# Patient Record
Sex: Female | Born: 1935 | Race: White | Hispanic: No | Marital: Married | Smoking: Never smoker
Health system: Southern US, Community
[De-identification: ages and names within clinical notes are randomized; demographics above are authoritative.]

## PROBLEM LIST (undated history)

## (undated) DIAGNOSIS — E7800 Pure hypercholesterolemia, unspecified: Secondary | ICD-10-CM

## (undated) DIAGNOSIS — I340 Nonrheumatic mitral (valve) insufficiency: Secondary | ICD-10-CM

## (undated) DIAGNOSIS — E569 Vitamin deficiency, unspecified: Secondary | ICD-10-CM

## (undated) DIAGNOSIS — I10 Essential (primary) hypertension: Secondary | ICD-10-CM

## (undated) HISTORY — PX: APPENDECTOMY: SHX54

---

## 2004-08-04 ENCOUNTER — Ambulatory Visit: Payer: Self-pay | Admitting: Internal Medicine

## 2004-09-14 ENCOUNTER — Emergency Department: Payer: Self-pay | Admitting: Emergency Medicine

## 2004-09-15 ENCOUNTER — Ambulatory Visit: Payer: Self-pay | Admitting: Emergency Medicine

## 2005-06-04 IMAGING — CT CT ABD-PELV W/O CM
1 of 2 series · 15 of 32 positions shown, 19 images · non-contrast
Comparison: none

REASON FOR EXAM: RUQ pain, blood in urine, evaluate for kidney stone
COMMENTS:

PROCEDURE:     CT  - CT ABDOMEN AND PELVIS W[DATE]  [DATE]
RESULT:     Emergent unenhanced images were obtained through the abdomen.
The scan was originally read by [REDACTED].

[Series 2: stone · axial · 0.69mm/px · z∈[-626,-210]mm · 15 of 153 slices shown, 19 images]
[im 7/153  soft-tissue]
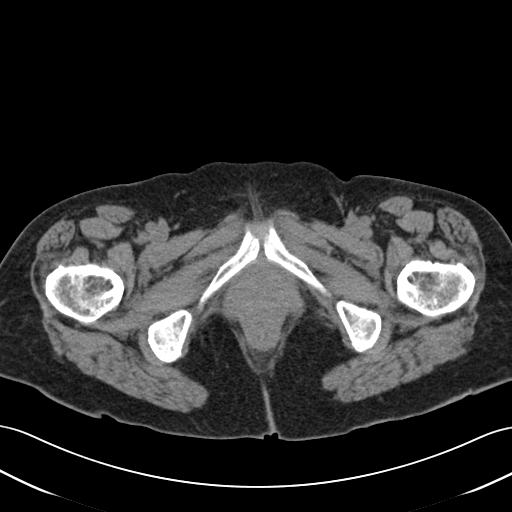
[im 7/153  bone]
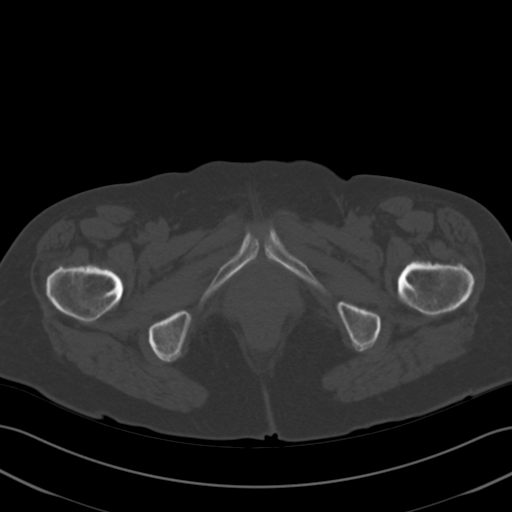
[im 20/153  soft-tissue]
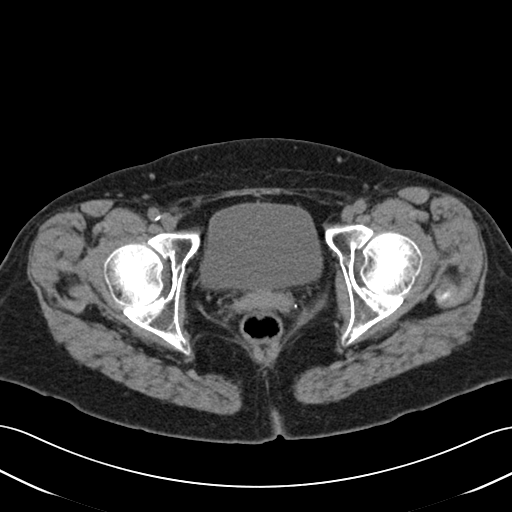
[im 32/153  soft-tissue]
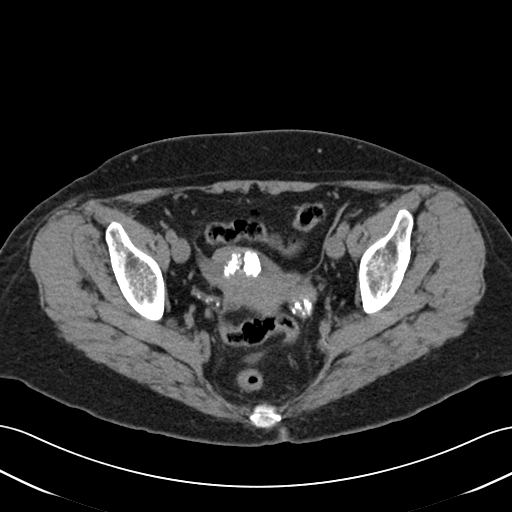
[im 45/153  soft-tissue]
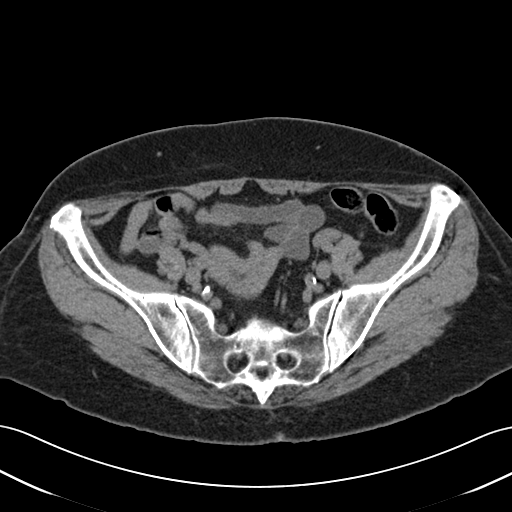
[im 51/153  soft-tissue]
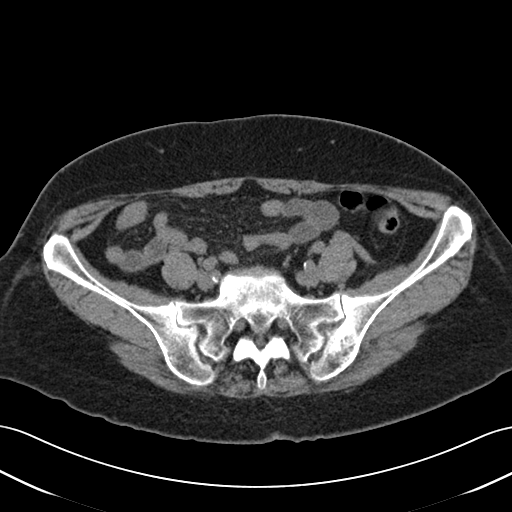
[im 64/153  soft-tissue]
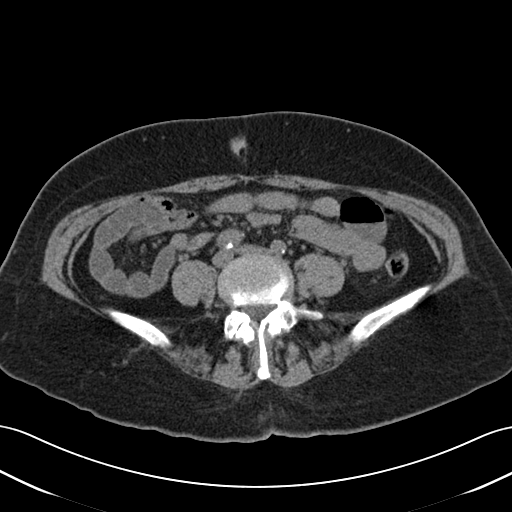
[im 77/153  soft-tissue]
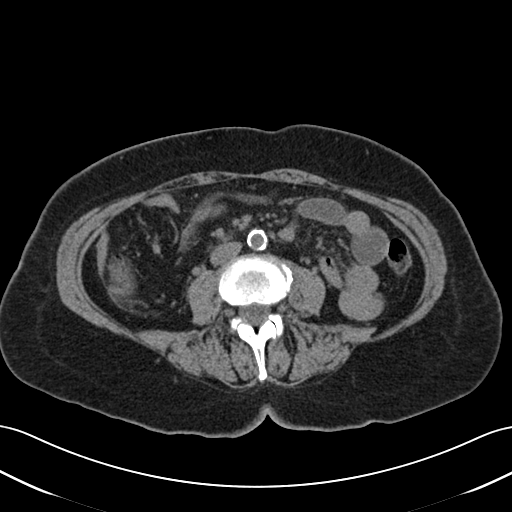
[im 89/153  soft-tissue]
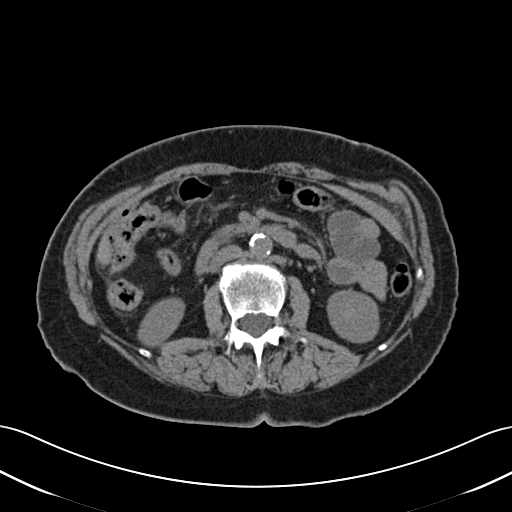
[im 102/153  soft-tissue]
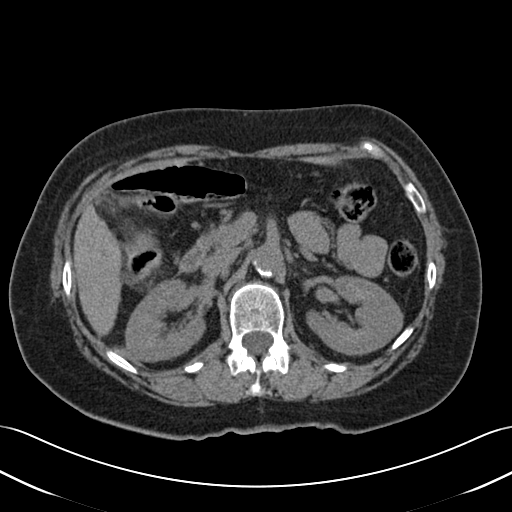
[im 102/153  bone]
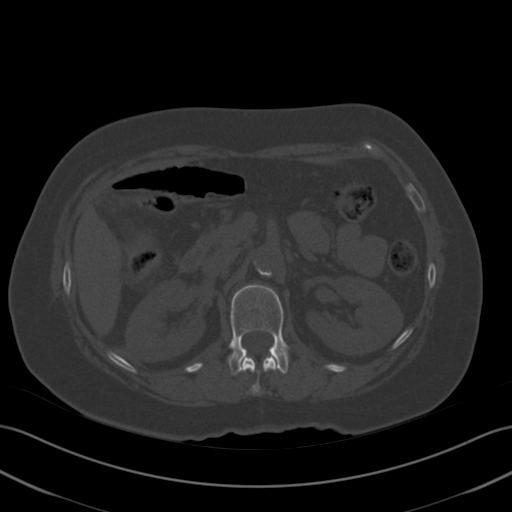
[im 108/153  soft-tissue]
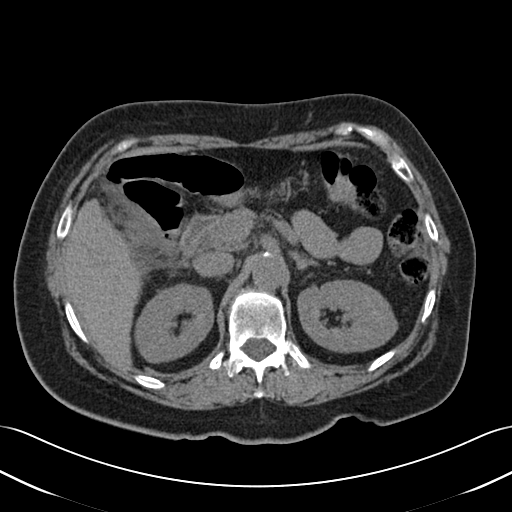
[im 121/153  soft-tissue]
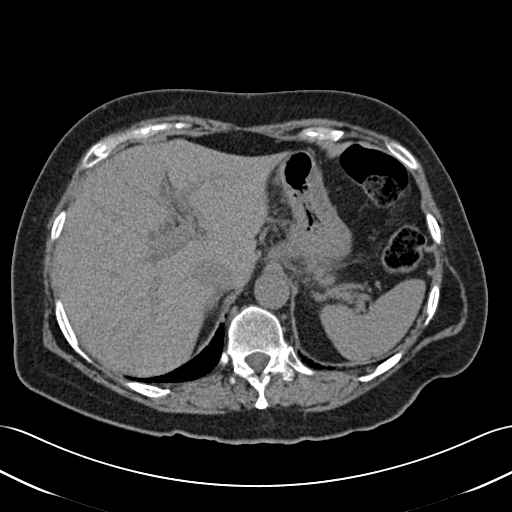
[im 127/153  lung]
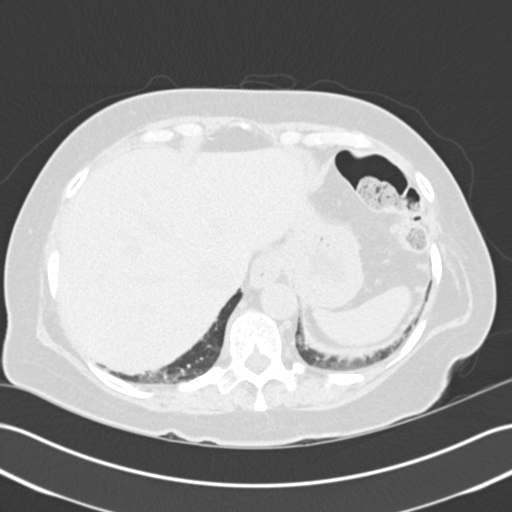
[im 134/153  soft-tissue]
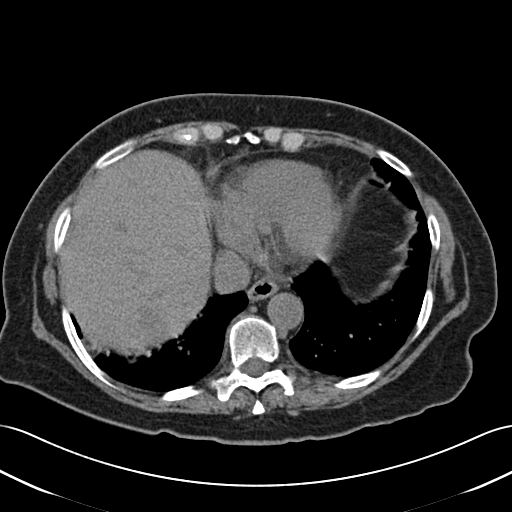
[im 134/153  lung]
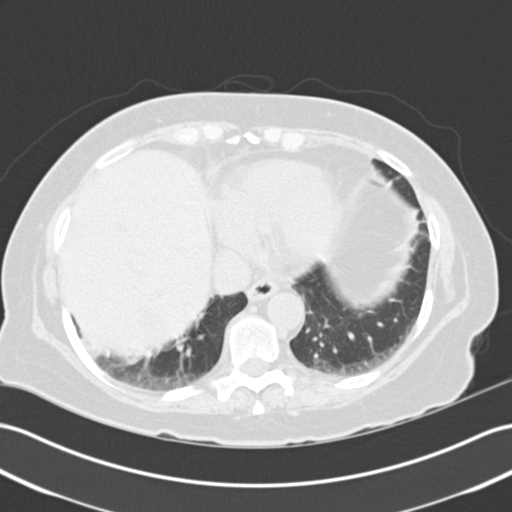
[im 140/153  lung]
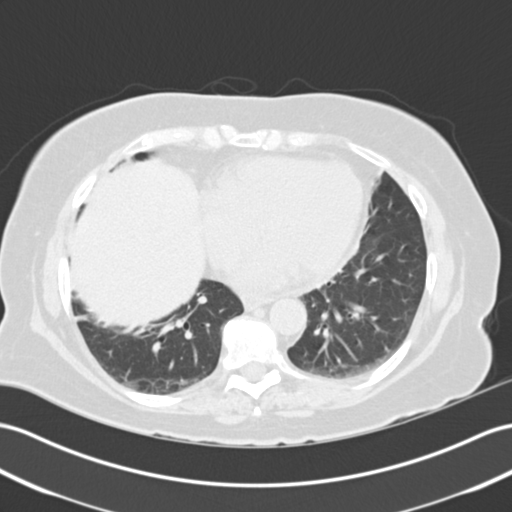
[im 146/153  soft-tissue]
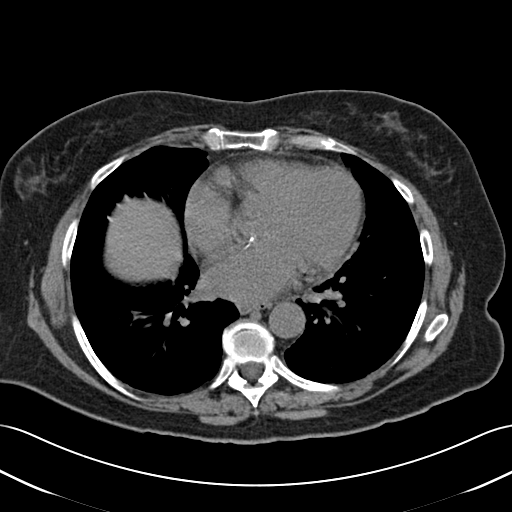
[im 146/153  lung]
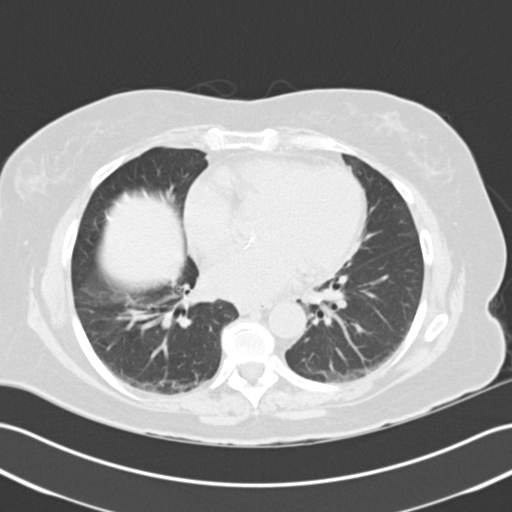

[15 of 32 positions shown; findings below may reference images not displayed]

FINDINGS: No renal or ureteral calculi are identified. No hydronephrosis is
seen. There is noted dirtiness to the fat in the RIGHT upper quadrant. This
may be originating either from some underlying diverticulitis in the hepatic
flexure region versus cholecystitis although no definite gallstones are
seen. The gallbladder appears contracted. One may wish to obtain Gallbladder
Ultrasound or possibly a HIDA Scan.

The lung bases appear clear with no effusions.
IMPRESSION: 1.     No renal or ureteral calculi are identified.
2.     Inflammatory process in the RIGHT upper quadrant in the hepatic
flexure region versus the gallbladder. Further imaging would be recommended
as well as clinical correlation.
3.     There are calcified uterine fibroids present.

## 2005-06-05 IMAGING — CT CT ABD-PELV W/ CM
1 of 2 series · 14 of 32 positions shown, 18 images · non-contrast
Comparison: none

REASON FOR EXAM: RUQ pain and nausea
COMMENTS:

[Series 2: abdomen · axial · 0.62mm/px · z∈[-437,-5]mm · 14 of 60 slices shown, 18 images]
[im 3/60  soft-tissue]
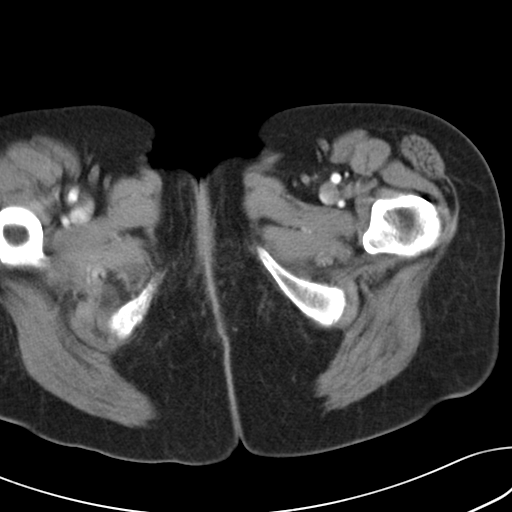
[im 3/60  bone]
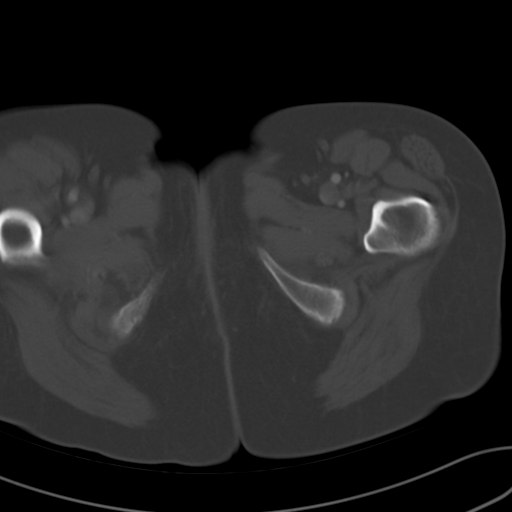
[im 8/60  soft-tissue]
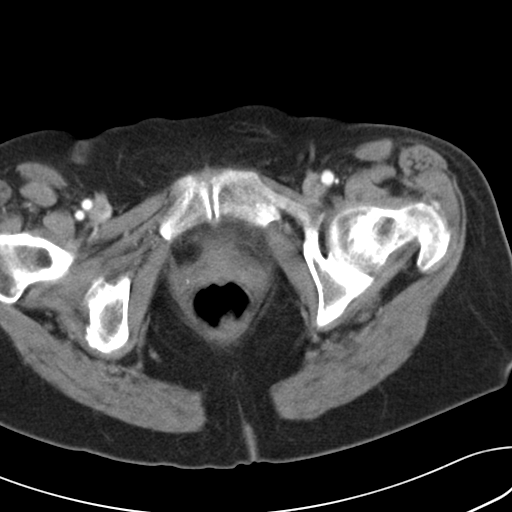
[im 13/60  soft-tissue]
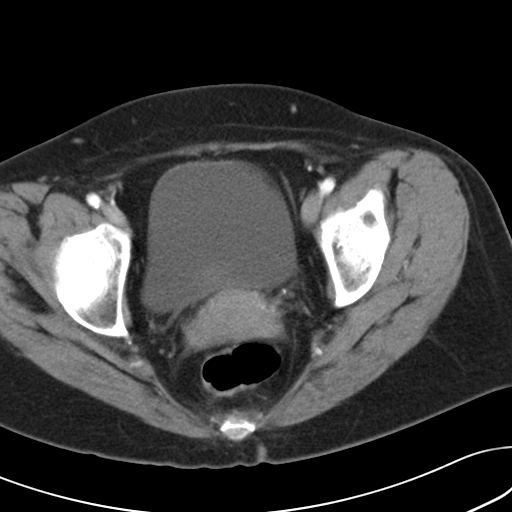
[im 18/60  soft-tissue]
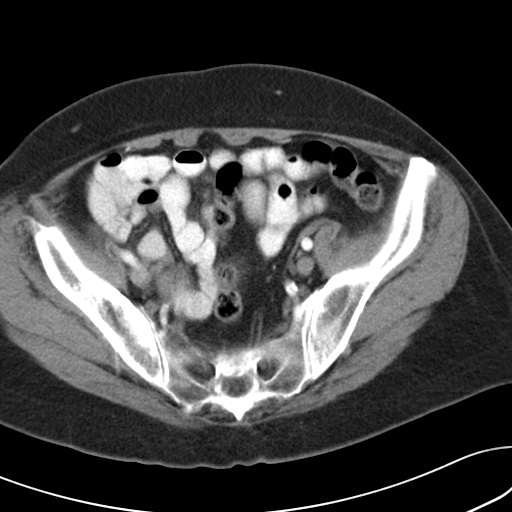
[im 23/60  soft-tissue]
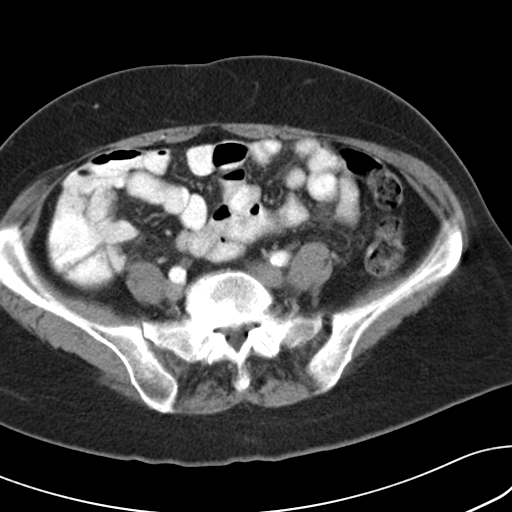
[im 28/60  soft-tissue]
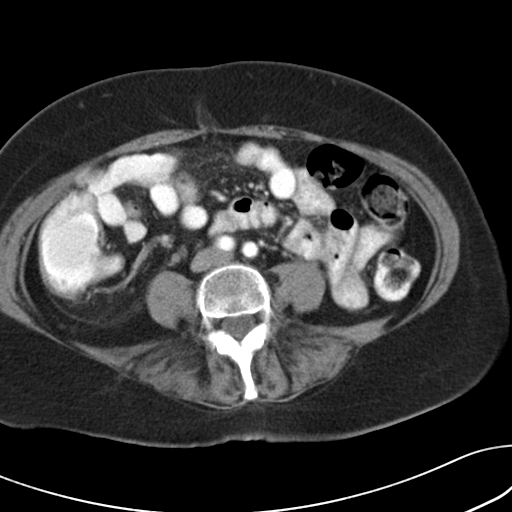
[im 32/60  soft-tissue]
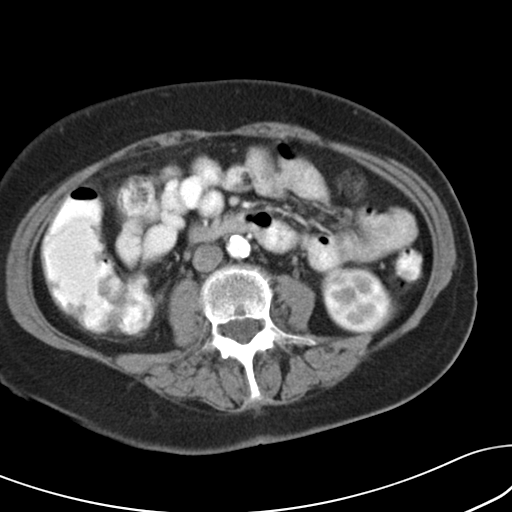
[im 37/60  soft-tissue]
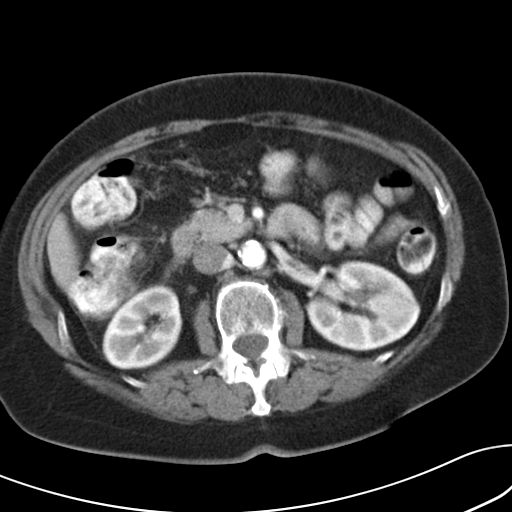
[im 42/60  soft-tissue]
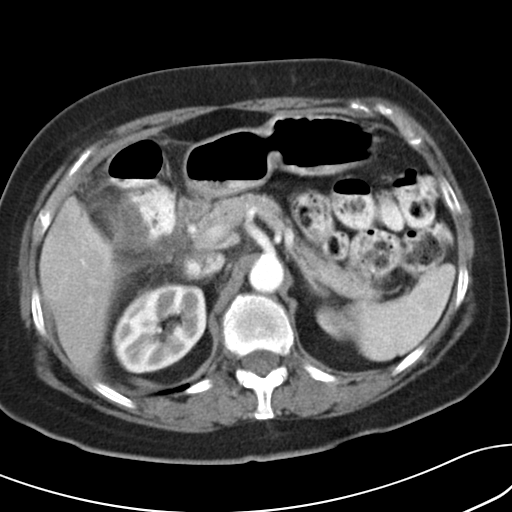
[im 42/60  bone]
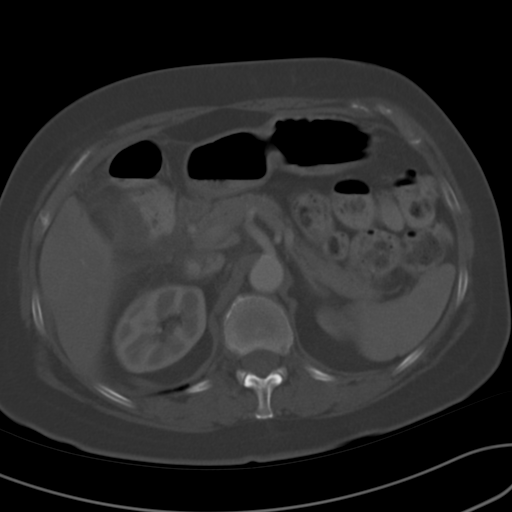
[im 47/60  soft-tissue]
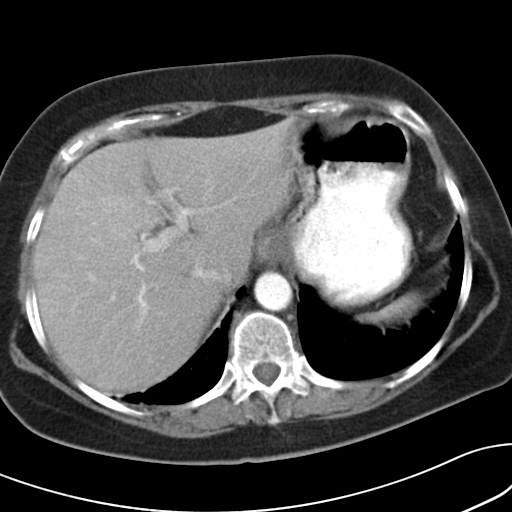
[im 50/60  lung]
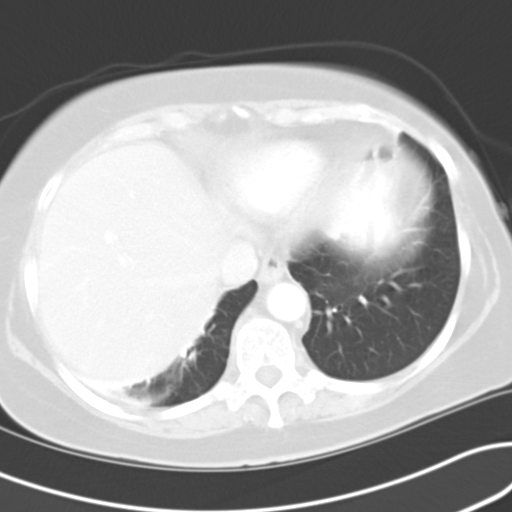
[im 52/60  soft-tissue]
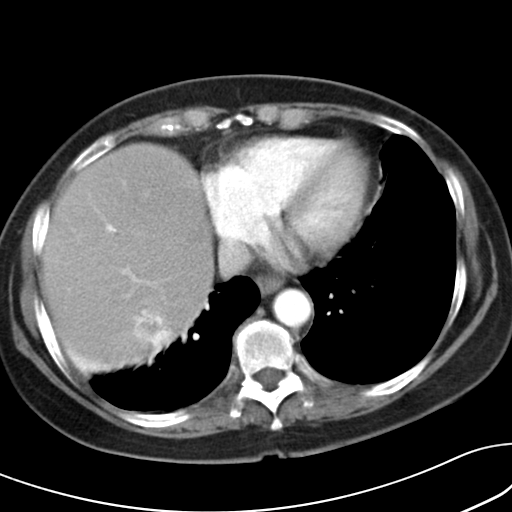
[im 52/60  lung]
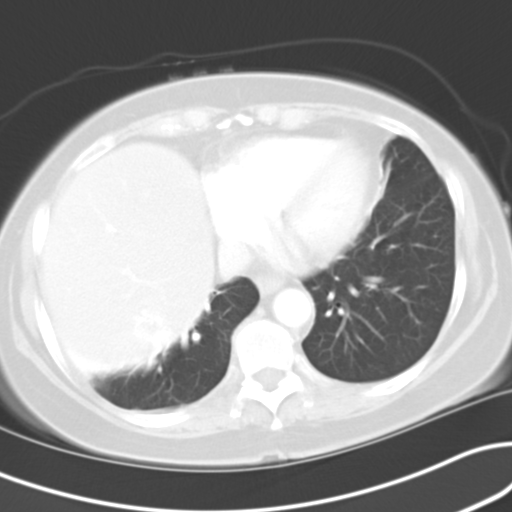
[im 55/60  lung]
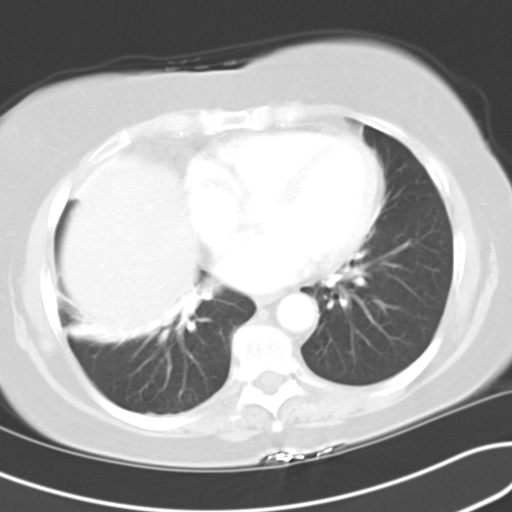
[im 57/60  soft-tissue]
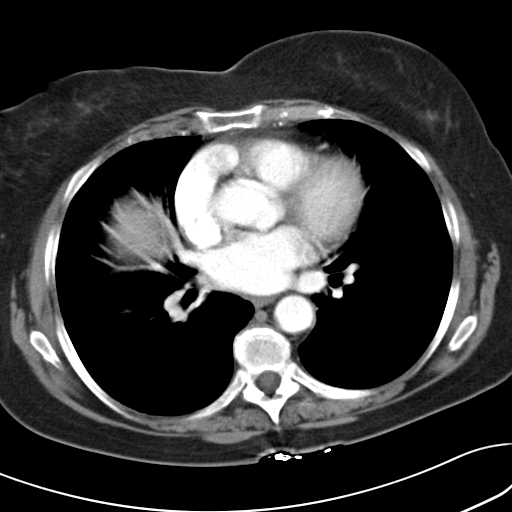
[im 57/60  lung]
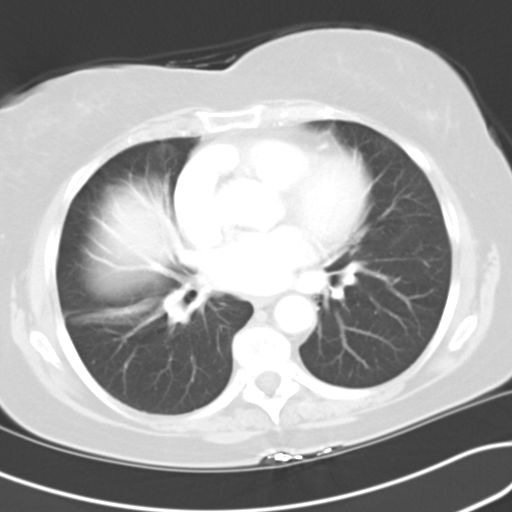

[14 of 32 positions shown; findings below may reference images not displayed]

PROCEDURE:     CT  - CT ABDOMEN / PELVIS  W  - September 15, 2004  [DATE]

RESULT:     8.0 mm helical cuts are performed through the abdomen and pelvis
with oral and 100 cc's of Ssovue-TZE contrast.

The study is compared with a prior study from 09/14/2004. The previous
examination showed no evidence of renal or ureteral calculi but there was an
inflammatory process in the RIGHT upper quadrant.

Today's examination shows the lung bases to be clear. No pleural or
pericardial effusions are present. There are vascular calcifications in the
coronary arteries. The liver, spleen, stomach, pancreas, adrenals and
kidneys are unremarkable. There is evidence of pericholecystic inflammatory
changes as well as inflammatory changes around the distal ascending colon. I
cannot distinguish between a possible cholecystitis or colitis in this
patient. There is no obvious intrahepatic biliary dilatation to suspect a
biliary obstruction nor is there evidence of obvious cholelithiasis. Further
evaluation with ultrasound is recommended. No evidence of an abscess is
identified. There is no free intraperitoneal fluid, air or adenopathy. A
few, scattered mesenteric lymph nodes are identified but all measure well
less than 1.0 cm in short axis dimension. The bony structures show
degenerative change without lytic or blastic bony lesion. Cuts through the
pelvis show the uterus to be present. It contains numerous calcifications
consistent with old fibroids. The bladder distends normally without evidence
of filling defect or wall thickening. No inguinal mass or adenopathy is
noted.
IMPRESSION: 1.     Inflammatory changes are again identified within the RIGHT upper
quadrant. I cannot determine with any certainty if this represents
inflammatory changes originating from the gallbladder or from the distal
ascending colon. There is no obvious cholelithiasis, wall thickening or
intrahepatic biliary dilatation. There is subtle thickening of the distal
ascending colon. This likely either represents cholecystitis or colitis.
Further evaluation of the gallbladder with ultrasound would be helpful to
determine if there is wall thickening or a nonradiopaque stone within the
gallbladder.
2.     The remaining solid organs of the abdomen are unremarkable.
3.     No free intraperitoneal fluid, air or adenopathy.
4.     Calcified uterine fibroids.
5.     Degenerative bony changes without lytic or blastic bony lesion.

## 2005-08-12 ENCOUNTER — Ambulatory Visit: Payer: Self-pay | Admitting: Internal Medicine

## 2006-06-22 ENCOUNTER — Ambulatory Visit: Payer: Self-pay | Admitting: Gastroenterology

## 2006-08-17 ENCOUNTER — Ambulatory Visit: Payer: Self-pay | Admitting: Internal Medicine

## 2007-08-18 ENCOUNTER — Ambulatory Visit: Payer: Self-pay | Admitting: Internal Medicine

## 2007-08-29 ENCOUNTER — Ambulatory Visit: Payer: Self-pay | Admitting: Internal Medicine

## 2009-01-07 ENCOUNTER — Ambulatory Visit: Payer: Self-pay | Admitting: Internal Medicine

## 2010-01-21 ENCOUNTER — Ambulatory Visit: Payer: Self-pay | Admitting: Internal Medicine

## 2011-02-23 ENCOUNTER — Ambulatory Visit: Payer: Self-pay | Admitting: Internal Medicine

## 2012-02-24 ENCOUNTER — Ambulatory Visit: Payer: Self-pay | Admitting: Internal Medicine

## 2013-02-27 ENCOUNTER — Ambulatory Visit: Payer: Self-pay | Admitting: Internal Medicine

## 2014-03-01 ENCOUNTER — Ambulatory Visit: Payer: Self-pay | Admitting: Internal Medicine

## 2014-08-09 ENCOUNTER — Ambulatory Visit
Admit: 2014-08-09 | Disposition: A | Discharge status: Home / Self Care | Payer: Self-pay | Attending: Neurology | Admitting: Neurology

## 2017-07-07 ENCOUNTER — Other Ambulatory Visit: Payer: Self-pay

## 2017-07-08 NOTE — Discharge Instructions (Signed)

## 2017-07-14 ENCOUNTER — Encounter
Admission: RE | Disposition: A | Discharge status: Home / Self Care | Payer: Self-pay | Source: Ambulatory Visit | Attending: Ophthalmology

## 2017-07-14 ENCOUNTER — Ambulatory Visit: Payer: Medicare Other | Admitting: Anesthesiology

## 2017-07-14 ENCOUNTER — Ambulatory Visit
Admission: RE | Admit: 2017-07-14 | Discharge: 2017-07-14 | Disposition: A | Discharge status: Home / Self Care | Payer: Medicare Other | Source: Ambulatory Visit | Attending: Ophthalmology | Admitting: Ophthalmology

## 2017-07-14 DIAGNOSIS — E559 Vitamin D deficiency, unspecified: Secondary | ICD-10-CM | POA: Insufficient documentation

## 2017-07-14 DIAGNOSIS — I340 Nonrheumatic mitral (valve) insufficiency: Secondary | ICD-10-CM | POA: Insufficient documentation

## 2017-07-14 DIAGNOSIS — E7800 Pure hypercholesterolemia, unspecified: Secondary | ICD-10-CM | POA: Insufficient documentation

## 2017-07-14 DIAGNOSIS — Z79899 Other long term (current) drug therapy: Secondary | ICD-10-CM | POA: Insufficient documentation

## 2017-07-14 DIAGNOSIS — Z7982 Long term (current) use of aspirin: Secondary | ICD-10-CM | POA: Insufficient documentation

## 2017-07-14 DIAGNOSIS — H2512 Age-related nuclear cataract, left eye: Secondary | ICD-10-CM | POA: Diagnosis not present

## 2017-07-14 DIAGNOSIS — I10 Essential (primary) hypertension: Secondary | ICD-10-CM | POA: Insufficient documentation

## 2017-07-14 DIAGNOSIS — E538 Deficiency of other specified B group vitamins: Secondary | ICD-10-CM | POA: Insufficient documentation

## 2017-07-14 HISTORY — DX: Pure hypercholesterolemia, unspecified: E78.00

## 2017-07-14 HISTORY — DX: Vitamin deficiency, unspecified: E56.9

## 2017-07-14 HISTORY — DX: Nonrheumatic mitral (valve) insufficiency: I34.0

## 2017-07-14 HISTORY — DX: Essential (primary) hypertension: I10

## 2017-07-14 HISTORY — PX: CATARACT EXTRACTION W/PHACO: SHX586

## 2017-07-14 SURGERY — PHACOEMULSIFICATION, CATARACT, WITH IOL INSERTION
Anesthesia: Monitor Anesthesia Care | Site: Eye | Laterality: Left | Wound class: Clean

## 2017-07-14 MED ORDER — BALANCED SALT IO SOLN
INTRAOCULAR | Status: DC | PRN
  Administered 2017-07-14: 1 mL

## 2017-07-14 MED ORDER — BRIMONIDINE TARTRATE-TIMOLOL 0.2-0.5 % OP SOLN
OPHTHALMIC | Status: DC | PRN
  Administered 2017-07-14: 1 [drp] via OPHTHALMIC

## 2017-07-14 MED ORDER — MIDAZOLAM HCL 2 MG/2ML IJ SOLN
INTRAMUSCULAR | Status: DC | PRN
  Administered 2017-07-14: 1 mg via INTRAVENOUS

## 2017-07-14 MED ORDER — NA HYALUR & NA CHOND-NA HYALUR 0.4-0.35 ML IO KIT
PACK | INTRAOCULAR | Status: DC | PRN
  Administered 2017-07-14: 1 mL via INTRAOCULAR

## 2017-07-14 MED ORDER — ARMC OPHTHALMIC DILATING DROPS
1.0000 "application " | OPHTHALMIC | Status: DC | PRN
  Administered 2017-07-14 (×3): 1 via OPHTHALMIC

## 2017-07-14 MED ORDER — FENTANYL CITRATE (PF) 100 MCG/2ML IJ SOLN
INTRAMUSCULAR | Status: DC | PRN
  Administered 2017-07-14: 50 ug via INTRAVENOUS

## 2017-07-14 MED ORDER — CEFUROXIME OPHTHALMIC INJECTION 1 MG/0.1 ML
INJECTION | OPHTHALMIC | Status: DC | PRN
  Administered 2017-07-14: 0.1 mL via INTRACAMERAL

## 2017-07-14 MED ORDER — MOXIFLOXACIN HCL 0.5 % OP SOLN
1.0000 [drp] | OPHTHALMIC | Status: DC | PRN
  Administered 2017-07-14 (×3): 1 [drp] via OPHTHALMIC

## 2017-07-14 SURGICAL SUPPLY — 25 items
CANNULA ANT/CHMB 27GA (MISCELLANEOUS) ×3 IMPLANT
CARTRIDGE ABBOTT (MISCELLANEOUS) IMPLANT
GLOVE SURG LX 7.5 STRW (GLOVE) ×4
GLOVE SURG LX STRL 7.5 STRW (GLOVE) ×2 IMPLANT
GLOVE SURG TRIUMPH 8.0 PF LTX (GLOVE) ×3 IMPLANT
GOWN STRL REUS W/ TWL LRG LVL3 (GOWN DISPOSABLE) ×2 IMPLANT
GOWN STRL REUS W/TWL LRG LVL3 (GOWN DISPOSABLE) ×4
LENS IOL TECNIS ITEC 18.0 (Intraocular Lens) ×3 IMPLANT
MARKER SKIN DUAL TIP RULER LAB (MISCELLANEOUS) ×3 IMPLANT
NDL RETROBULBAR .5 NSTRL (NEEDLE) IMPLANT
NEEDLE FILTER BLUNT 18X 1/2SAF (NEEDLE) ×2
NEEDLE FILTER BLUNT 18X1 1/2 (NEEDLE) ×1 IMPLANT
PACK CATARACT BRASINGTON (MISCELLANEOUS) ×3 IMPLANT
PACK EYE AFTER SURG (MISCELLANEOUS) ×3 IMPLANT
PACK OPTHALMIC (MISCELLANEOUS) ×3 IMPLANT
RING MALYGIN 7.0 (MISCELLANEOUS) IMPLANT
SUT ETHILON 10-0 CS-B-6CS-B-6 (SUTURE)
SUT VICRYL  9 0 (SUTURE)
SUT VICRYL 9 0 (SUTURE) IMPLANT
SUTURE EHLN 10-0 CS-B-6CS-B-6 (SUTURE) IMPLANT
SYR 3ML LL SCALE MARK (SYRINGE) ×3 IMPLANT
SYR 5ML LL (SYRINGE) ×3 IMPLANT
SYR TB 1ML LUER SLIP (SYRINGE) ×3 IMPLANT
WATER STERILE IRR 500ML POUR (IV SOLUTION) ×3 IMPLANT
WIPE NON LINTING 3.25X3.25 (MISCELLANEOUS) ×3 IMPLANT

## 2017-07-14 NOTE — H&P (Signed)
The History and Physical notes are on paper, have been signed, and are to be scanned. The patient remains stable and unchanged from the H&P.   Previous H&P reviewed, patient examined, and there are no changes.  Diana Morrison 07/14/2017 8:11 AM

## 2017-07-14 NOTE — Transfer of Care (Signed)
Immediate Anesthesia Transfer of Care Note  Patient: Diana Morrison  Procedure(s) Performed: CATARACT EXTRACTION PHACO AND INTRAOCULAR LENS PLACEMENT ([facility]) LEFT (Left Eye)  Patient Location: PACU  Anesthesia Type: MAC  Level of Consciousness: awake, alert  and patient cooperative  Airway and Oxygen Therapy: Patient Spontanous Breathing and Patient connected to supplemental oxygen  Post-op Assessment: Post-op Vital signs reviewed, Patient's Cardiovascular Status Stable, Respiratory Function Stable, Patent Airway and No signs of Nausea or vomiting  Post-op Vital Signs: Reviewed and stable  Complications: No apparent anesthesia complications

## 2017-07-14 NOTE — Anesthesia Procedure Notes (Signed)
Procedure Name: MAC Date/Time: 07/14/2017 8:54 AM Performed by: Cameron Ali, CRNA Pre-anesthesia Checklist: Patient identified, Emergency Drugs available, Suction available, Timeout performed and Patient being monitored Patient Re-evaluated:Patient Re-evaluated prior to induction Oxygen Delivery Method: Nasal cannula Placement Confirmation: positive ETCO2

## 2017-07-14 NOTE — Anesthesia Postprocedure Evaluation (Signed)
Anesthesia Post Note  Patient: Diana Morrison  Procedure(s) Performed: CATARACT EXTRACTION PHACO AND INTRAOCULAR LENS PLACEMENT ([place]) LEFT (Left Eye)  Patient location during evaluation: PACU Anesthesia Type: MAC Level of consciousness: awake and alert Pain management: pain level controlled Vital Signs Assessment: post-procedure vital signs reviewed and stable Respiratory status: spontaneous breathing, nonlabored ventilation, respiratory function stable and patient connected to nasal cannula oxygen Cardiovascular status: stable and blood pressure returned to baseline Postop Assessment: no apparent nausea or vomiting Anesthetic complications: no    Alisa Graff

## 2017-07-14 NOTE — Anesthesia Preprocedure Evaluation (Signed)
Anesthesia Evaluation  Patient identified by MRN, date of birth, ID band Patient awake    Reviewed: Allergy & Precautions, H&P , NPO status , Patient's Chart, lab work & pertinent test results, reviewed documented beta blocker date and time   Airway Mallampati: II  TM Distance: >3 FB Neck ROM: full    Dental no notable dental hx.    Pulmonary neg pulmonary ROS,    Pulmonary exam normal breath sounds clear to auscultation       Cardiovascular Exercise Tolerance: Good hypertension, + Valvular Problems/Murmurs MR  Rhythm:regular Rate:Normal     Neuro/Psych negative neurological ROS  negative psych ROS   GI/Hepatic negative GI ROS, Neg liver ROS,   Endo/Other  negative endocrine ROS  Renal/GU negative Renal ROS  negative genitourinary   Musculoskeletal   Abdominal   Peds  Hematology negative hematology ROS (+)   Anesthesia Other Findings   Reproductive/Obstetrics negative OB ROS                             Anesthesia Physical Anesthesia Plan  ASA: III  Anesthesia Plan: MAC   Post-op Pain Management:    Induction:   PONV Risk Score and Plan:   Airway Management Planned:   Additional Equipment:   Intra-op Plan:   Post-operative Plan:   Informed Consent: I have reviewed the patients History and Physical, chart, labs and discussed the procedure including the risks, benefits and alternatives for the proposed anesthesia with the patient or authorized representative who has indicated his/her understanding and acceptance.   Dental Advisory Given  Plan Discussed with: CRNA  Anesthesia Plan Comments:         Anesthesia Quick Evaluation

## 2017-07-14 NOTE — Op Note (Signed)
OPERATIVE NOTE  Diana Morrison [id number] 07/14/2017   PREOPERATIVE DIAGNOSIS:  Nuclear sclerotic cataract left eye. H25.12   POSTOPERATIVE DIAGNOSIS:    Nuclear sclerotic cataract left eye.     PROCEDURE:  Phacoemusification with posterior chamber intraocular lens placement of the left eye   LENS:   Implant Name Type Inv. Item Serial No. Manufacturer Lot No. LRB No. Used  LENS IOL DIOP 18.0 - [id number] Intraocular Lens LENS IOL DIOP 18.0 [id number] AMO  Left 1        ULTRASOUND TIME: 12  % of 1 minutes 39 seconds, CDE 11.7  SURGEON:  Deirdre Evenerhadwick R. Hildred Mollica, MD   ANESTHESIA:  Topical with tetracaine drops and 2% Xylocaine jelly, augmented with 1% preservative-free intracameral lidocaine.    COMPLICATIONS:  None.   DESCRIPTION OF PROCEDURE:  The patient was identified in the holding room and transported to the operating room and placed in the supine position under the operating microscope.  The left eye was identified as the operative eye and it was prepped and draped in the usual sterile ophthalmic fashion.   A 1 millimeter clear-corneal paracentesis was made at the 1:30 position.  0.5 ml of preservative-free 1% lidocaine was injected into the anterior chamber.  The anterior chamber was filled with Viscoat viscoelastic.  A 2.4 millimeter keratome was used to make a near-clear corneal incision at the 10:30 position.  .  A curvilinear capsulorrhexis was made with a cystotome and capsulorrhexis forceps.  Balanced salt solution was used to hydrodissect and hydrodelineate the nucleus.   Phacoemulsification was then used in stop and chop fashion to remove the lens nucleus and epinucleus.  The remaining cortex was then removed using the irrigation and aspiration handpiece. Provisc was then placed into the capsular bag to distend it for lens placement.  A lens was then injected into the capsular bag.  The remaining viscoelastic was aspirated.   Wounds were hydrated with balanced salt solution.   The anterior chamber was inflated to a physiologic pressure with balanced salt solution.  No wound leaks were noted. Cefuroxime 0.1 ml of a 10mg /ml solution was injected into the anterior chamber for a dose of 1 mg of intracameral antibiotic at the completion of the case.   Timolol and Brimonidine drops were applied to the eye.  The patient was taken to the recovery room in stable condition without complications of anesthesia or surgery.  [name] 07/14/2017, 9:16 AM

## 2017-11-22 ENCOUNTER — Encounter: Payer: Self-pay | Admitting: *Deleted

## 2017-11-22 ENCOUNTER — Other Ambulatory Visit: Payer: Self-pay

## 2017-11-24 NOTE — Discharge Instructions (Signed)

## 2017-11-30 ENCOUNTER — Ambulatory Visit: Payer: Medicare Other | Admitting: Anesthesiology

## 2017-11-30 ENCOUNTER — Encounter
Admission: RE | Disposition: A | Discharge status: Home / Self Care | Payer: Self-pay | Source: Ambulatory Visit | Attending: Ophthalmology

## 2017-11-30 ENCOUNTER — Ambulatory Visit
Admission: RE | Admit: 2017-11-30 | Discharge: 2017-11-30 | Disposition: A | Discharge status: Home / Self Care | Payer: Medicare Other | Source: Ambulatory Visit | Attending: Ophthalmology | Admitting: Ophthalmology

## 2017-11-30 DIAGNOSIS — E559 Vitamin D deficiency, unspecified: Secondary | ICD-10-CM | POA: Diagnosis not present

## 2017-11-30 DIAGNOSIS — E7800 Pure hypercholesterolemia, unspecified: Secondary | ICD-10-CM | POA: Insufficient documentation

## 2017-11-30 DIAGNOSIS — Z7982 Long term (current) use of aspirin: Secondary | ICD-10-CM | POA: Diagnosis not present

## 2017-11-30 DIAGNOSIS — E538 Deficiency of other specified B group vitamins: Secondary | ICD-10-CM | POA: Insufficient documentation

## 2017-11-30 DIAGNOSIS — Z79899 Other long term (current) drug therapy: Secondary | ICD-10-CM | POA: Insufficient documentation

## 2017-11-30 DIAGNOSIS — I10 Essential (primary) hypertension: Secondary | ICD-10-CM | POA: Insufficient documentation

## 2017-11-30 DIAGNOSIS — H2511 Age-related nuclear cataract, right eye: Secondary | ICD-10-CM | POA: Diagnosis not present

## 2017-11-30 HISTORY — PX: CATARACT EXTRACTION W/PHACO: SHX586

## 2017-11-30 SURGERY — PHACOEMULSIFICATION, CATARACT, WITH IOL INSERTION
Anesthesia: Monitor Anesthesia Care | Site: Eye | Laterality: Right | Wound class: "Clean "

## 2017-11-30 MED ORDER — ACETAMINOPHEN 160 MG/5ML PO SOLN: 325.0000 mg | Freq: Once | ORAL | Status: DC

## 2017-11-30 MED ORDER — EPINEPHRINE PF 1 MG/ML IJ SOLN
INTRAOCULAR | Status: DC | PRN
  Administered 2017-11-30: 61 mL via OPHTHALMIC

## 2017-11-30 MED ORDER — CYCLOPENTOLATE HCL 2 % OP SOLN
1.0000 [drp] | OPHTHALMIC | Status: DC | PRN
  Administered 2017-11-30 (×3): 1 [drp] via OPHTHALMIC

## 2017-11-30 MED ORDER — LACTATED RINGERS IV SOLN: INTRAVENOUS | Status: DC

## 2017-11-30 MED ORDER — TETRACAINE HCL 0.5 % OP SOLN
1.0000 [drp] | OPHTHALMIC | Status: DC | PRN
  Administered 2017-11-30 (×2): 1 [drp] via OPHTHALMIC

## 2017-11-30 MED ORDER — NA HYALUR & NA CHOND-NA HYALUR 0.4-0.35 ML IO KIT
PACK | INTRAOCULAR | Status: DC | PRN
  Administered 2017-11-30: 1 mL via INTRAOCULAR

## 2017-11-30 MED ORDER — LIDOCAINE HCL (PF) 2 % IJ SOLN
INTRAOCULAR | Status: DC | PRN
  Administered 2017-11-30: 1 mL

## 2017-11-30 MED ORDER — ACETAMINOPHEN 325 MG PO TABS: 325.0000 mg | ORAL_TABLET | Freq: Once | ORAL | Status: DC

## 2017-11-30 MED ORDER — MOXIFLOXACIN HCL 0.5 % OP SOLN
1.0000 [drp] | OPHTHALMIC | Status: DC | PRN
  Administered 2017-11-30 (×3): 1 [drp] via OPHTHALMIC

## 2017-11-30 MED ORDER — BRIMONIDINE TARTRATE-TIMOLOL 0.2-0.5 % OP SOLN
OPHTHALMIC | Status: DC | PRN
  Administered 2017-11-30: 1 [drp] via OPHTHALMIC

## 2017-11-30 MED ORDER — LACTATED RINGERS IV SOLN: 500.0000 mL | INTRAVENOUS | Status: DC

## 2017-11-30 MED ORDER — CEFUROXIME OPHTHALMIC INJECTION 1 MG/0.1 ML
INJECTION | OPHTHALMIC | Status: DC | PRN
  Administered 2017-11-30: 0.1 mL via OPHTHALMIC

## 2017-11-30 MED ORDER — PHENYLEPHRINE HCL 10 % OP SOLN
1.0000 [drp] | OPHTHALMIC | Status: DC | PRN
  Administered 2017-11-30 (×3): 1 [drp] via OPHTHALMIC

## 2017-11-30 MED ORDER — MIDAZOLAM HCL 2 MG/2ML IJ SOLN
INTRAMUSCULAR | Status: DC | PRN
  Administered 2017-11-30: 1 mg via INTRAVENOUS

## 2017-11-30 MED ORDER — FENTANYL CITRATE (PF) 100 MCG/2ML IJ SOLN
INTRAMUSCULAR | Status: DC | PRN
  Administered 2017-11-30: 50 ug via INTRAVENOUS

## 2017-11-30 SURGICAL SUPPLY — 27 items
CANNULA ANT/CHMB 27G (MISCELLANEOUS) ×1 IMPLANT
CANNULA ANT/CHMB 27GA (MISCELLANEOUS) ×3 IMPLANT
CARTRIDGE ABBOTT (MISCELLANEOUS) IMPLANT
GLOVE SURG LX 7.5 STRW (GLOVE) ×2
GLOVE SURG LX STRL 7.5 STRW (GLOVE) ×1 IMPLANT
GLOVE SURG TRIUMPH 8.0 PF LTX (GLOVE) ×3 IMPLANT
GOWN STRL REUS W/ TWL LRG LVL3 (GOWN DISPOSABLE) ×2 IMPLANT
GOWN STRL REUS W/TWL LRG LVL3 (GOWN DISPOSABLE) ×4
LENS IOL TECNIS ITEC 18.0 (Intraocular Lens) ×2 IMPLANT
MARKER SKIN DUAL TIP RULER LAB (MISCELLANEOUS) ×3 IMPLANT
NDL FILTER BLUNT 18X1 1/2 (NEEDLE) ×1 IMPLANT
NDL RETROBULBAR .5 NSTRL (NEEDLE) IMPLANT
NEEDLE FILTER BLUNT 18X 1/2SAF (NEEDLE) ×2
NEEDLE FILTER BLUNT 18X1 1/2 (NEEDLE) ×1 IMPLANT
PACK CATARACT BRASINGTON (MISCELLANEOUS) ×3 IMPLANT
PACK EYE AFTER SURG (MISCELLANEOUS) ×3 IMPLANT
PACK OPTHALMIC (MISCELLANEOUS) ×3 IMPLANT
RING MALYGIN 7.0 (MISCELLANEOUS) IMPLANT
SUT ETHILON 10-0 CS-B-6CS-B-6 (SUTURE)
SUT VICRYL  9 0 (SUTURE)
SUT VICRYL 9 0 (SUTURE) IMPLANT
SUTURE EHLN 10-0 CS-B-6CS-B-6 (SUTURE) IMPLANT
SYR 3ML LL SCALE MARK (SYRINGE) ×3 IMPLANT
SYR 5ML LL (SYRINGE) ×3 IMPLANT
SYR TB 1ML LUER SLIP (SYRINGE) ×3 IMPLANT
WATER STERILE IRR 500ML POUR (IV SOLUTION) ×3 IMPLANT
WIPE NON LINTING 3.25X3.25 (MISCELLANEOUS) ×3 IMPLANT

## 2017-11-30 NOTE — Anesthesia Preprocedure Evaluation (Signed)
Anesthesia Evaluation  Patient identified by MRN, date of birth, ID band Patient awake    Reviewed: Allergy & Precautions, H&P , NPO status , Patient's Chart, lab work & pertinent test results, reviewed documented beta blocker date and time   Airway Mallampati: II  TM Distance: >3 FB Neck ROM: full    Dental no notable dental hx.    Pulmonary neg pulmonary ROS,    Pulmonary exam normal breath sounds clear to auscultation       Cardiovascular Exercise Tolerance: Good hypertension, Normal cardiovascular exam+ Valvular Problems/Murmurs MR  Rhythm:regular Rate:Normal     Neuro/Psych negative neurological ROS  negative psych ROS   GI/Hepatic negative GI ROS, Neg liver ROS,   Endo/Other  negative endocrine ROS  Renal/GU negative Renal ROS  negative genitourinary   Musculoskeletal   Abdominal   Peds  Hematology negative hematology ROS (+)   Anesthesia Other Findings   Reproductive/Obstetrics negative OB ROS                             Anesthesia Physical  Anesthesia Plan  ASA: II  Anesthesia Plan: MAC   Post-op Pain Management:    Induction:   PONV Risk Score and Plan: 2 and Midazolam and Treatment may vary due to age or medical condition  Airway Management Planned:   Additional Equipment:   Intra-op Plan:   Post-operative Plan:   Informed Consent: I have reviewed the patients History and Physical, chart, labs and discussed the procedure including the risks, benefits and alternatives for the proposed anesthesia with the patient or authorized representative who has indicated his/her understanding and acceptance.   Dental Advisory Given  Plan Discussed with: CRNA  Anesthesia Plan Comments:         Anesthesia Quick Evaluation

## 2017-11-30 NOTE — Anesthesia Procedure Notes (Signed)
Procedure Name: MAC Performed by: Trammell Bowden, CRNA Pre-anesthesia Checklist: Patient identified, Emergency Drugs available, Suction available, Timeout performed and Patient being monitored Patient Re-evaluated:Patient Re-evaluated prior to induction Oxygen Delivery Method: Nasal cannula Placement Confirmation: positive ETCO2       

## 2017-11-30 NOTE — H&P (Signed)
The History and Physical notes are on paper, have been signed, and are to be scanned. The patient remains stable and unchanged from the H&P.   Previous H&P reviewed, patient examined, and there are no changes.  Carmel Waddington 11/30/2017 8:48 AM

## 2017-11-30 NOTE — Transfer of Care (Signed)
Immediate Anesthesia Transfer of Care Note  Patient: Diana Morrison  Procedure(s) Performed: CATARACT EXTRACTION PHACO AND INTRAOCULAR LENS PLACEMENT ([facility])  RIGHT (Right Eye)  Patient Location: PACU  Anesthesia Type: MAC  Level of Consciousness: awake, alert  and patient cooperative  Airway and Oxygen Therapy: Patient Spontanous Breathing and Patient connected to supplemental oxygen  Post-op Assessment: Post-op Vital signs reviewed, Patient's Cardiovascular Status Stable, Respiratory Function Stable, Patent Airway and No signs of Nausea or vomiting  Post-op Vital Signs: Reviewed and stable  Complications: No apparent anesthesia complications

## 2017-11-30 NOTE — Anesthesia Postprocedure Evaluation (Signed)
Anesthesia Post Note  Patient: Diana Morrison  Procedure(s) Performed: CATARACT EXTRACTION PHACO AND INTRAOCULAR LENS PLACEMENT ([place])  RIGHT (Right Eye)  Patient location during evaluation: PACU Anesthesia Type: MAC Level of consciousness: awake and alert and oriented Pain management: satisfactory to patient Vital Signs Assessment: post-procedure vital signs reviewed and stable Respiratory status: spontaneous breathing, nonlabored ventilation and respiratory function stable Cardiovascular status: blood pressure returned to baseline and stable Postop Assessment: Adequate PO intake and No signs of nausea or vomiting Anesthetic complications: no    Raliegh Ip

## 2017-11-30 NOTE — Op Note (Signed)
LOCATION:  [facility]   PREOPERATIVE DIAGNOSIS:    Nuclear sclerotic cataract right eye. H25.11   POSTOPERATIVE DIAGNOSIS:  Nuclear sclerotic cataract right eye.     PROCEDURE:  Phacoemusification with posterior chamber intraocular lens placement of the right eye   LENS:   Implant Name Type Inv. Item Serial No. Manufacturer Lot No. LRB No. Used  LENS IOL DIOP 18.0 - [id number] Intraocular Lens LENS IOL DIOP 18.0 [id number]  Right 1        ULTRASOUND TIME: 23 % of 0 minutes, 57 seconds.  CDE 13.0   SURGEON:  Deirdre Evenerhadwick R. Adriena Manfre, MD   ANESTHESIA:  Topical with tetracaine drops and 2% Xylocaine jelly, augmented with 1% preservative-free intracameral lidocaine.    COMPLICATIONS:  None.   DESCRIPTION OF PROCEDURE:  The patient was identified in the holding room and transported to the operating room and placed in the supine position under the operating microscope.  The right eye was identified as the operative eye and it was prepped and draped in the usual sterile ophthalmic fashion.   A 1 millimeter clear-corneal paracentesis was made at the 12:00 position.  0.5 ml of preservative-free 1% lidocaine was injected into the anterior chamber. The anterior chamber was filled with Viscoat viscoelastic.  A 2.4 millimeter keratome was used to make a near-clear corneal incision at the 9:00 position.  A curvilinear capsulorrhexis was made with a cystotome and capsulorrhexis forceps.  Balanced salt solution was used to hydrodissect and hydrodelineate the nucleus.   Phacoemulsification was then used in stop and chop fashion to remove the lens nucleus and epinucleus.  The remaining cortex was then removed using the irrigation and aspiration handpiece. Provisc was then placed into the capsular bag to distend it for lens placement.  A lens was then injected into the capsular bag.  The remaining viscoelastic was aspirated.   Wounds were hydrated with balanced salt solution.  The anterior  chamber was inflated to a physiologic pressure with balanced salt solution.  No wound leaks were noted. Cefuroxime 0.1 ml of a 10mg /ml solution was injected into the anterior chamber for a dose of 1 mg of intracameral antibiotic at the completion of the case.   Timolol and Brimonidine drops were applied to the eye.  The patient was taken to the recovery room in stable condition without complications of anesthesia or surgery.   Darleen Moffitt 11/30/2017, 10:08 AM \

## 2017-12-01 ENCOUNTER — Encounter: Payer: Self-pay | Admitting: Ophthalmology

## 2020-01-10 ENCOUNTER — Other Ambulatory Visit: Payer: Self-pay | Admitting: Orthopedic Surgery

## 2020-01-10 DIAGNOSIS — S92321A Displaced fracture of second metatarsal bone, right foot, initial encounter for closed fracture: Secondary | ICD-10-CM

## 2020-01-30 ENCOUNTER — Ambulatory Visit: Payer: Medicare PPO | Attending: Orthopedic Surgery

## 2021-11-05 ENCOUNTER — Emergency Department
Admission: EM | Admit: 2021-11-05 | Discharge: 2021-11-05 | Disposition: A | Discharge status: Home / Self Care | Payer: Medicare PPO | Attending: Emergency Medicine | Admitting: Emergency Medicine

## 2021-11-05 ENCOUNTER — Other Ambulatory Visit: Payer: Self-pay

## 2021-11-05 DIAGNOSIS — F0390 Unspecified dementia without behavioral disturbance: Secondary | ICD-10-CM | POA: Diagnosis not present

## 2021-11-05 DIAGNOSIS — R55 Syncope and collapse: Secondary | ICD-10-CM | POA: Insufficient documentation

## 2021-11-05 LAB — BASIC METABOLIC PANEL
Anion gap: 7 (ref 5–15)
BUN: 14 mg/dL (ref 8–23)
CO2: 27 mmol/L (ref 22–32)
Calcium: 8.9 mg/dL (ref 8.9–10.3)
Chloride: 109 mmol/L (ref 98–111)
Creatinine, Ser: 0.97 mg/dL (ref 0.44–1.00)
GFR, Estimated: 57 mL/min — ABNORMAL LOW (ref >60)
Glucose, Bld: 143 mg/dL — ABNORMAL HIGH (ref 70–99)
Potassium: 3.7 mmol/L (ref 3.5–5.1)
Sodium: 143 mmol/L (ref 135–145)

## 2021-11-05 LAB — URINALYSIS, ROUTINE W REFLEX MICROSCOPIC
Bilirubin Urine: NEGATIVE
Glucose, UA: NEGATIVE mg/dL
Hgb urine dipstick: NEGATIVE
Ketones, ur: 5 mg/dL — AB
Leukocytes,Ua: NEGATIVE
Nitrite: NEGATIVE
Protein, ur: NEGATIVE mg/dL
Specific Gravity, Urine: 1.016 (ref 1.005–1.030)
pH: 6 (ref 5.0–8.0)

## 2021-11-05 LAB — TROPONIN I (HIGH SENSITIVITY)
Troponin I (High Sensitivity): 7 ng/L (ref <18)
Troponin I (High Sensitivity): 8 ng/L (ref <18)

## 2021-11-05 LAB — CBC
HCT: 40.4 % (ref 36.0–46.0)
Hemoglobin: 13.2 g/dL (ref 12.0–15.0)
MCH: 31.7 pg (ref 26.0–34.0)
MCHC: 32.7 g/dL (ref 30.0–36.0)
MCV: 96.9 fL (ref 80.0–100.0)
Platelets: 184 10*3/uL (ref 150–400)
RBC: 4.17 MIL/uL (ref 3.87–5.11)
RDW: 12.3 % (ref 11.5–15.5)
WBC: 6.3 10*3/uL (ref 4.0–10.5)
nRBC: 0 % (ref 0.0–0.2)

## 2021-11-05 MED ORDER — SODIUM CHLORIDE 0.9 % IV BOLUS
1000.0000 mL | Freq: Once | INTRAVENOUS | Status: AC
  Administered 2021-11-05: 1000 mL via INTRAVENOUS

## 2021-11-05 NOTE — ED Notes (Signed)
Pt and visitors know need for ua. Pt refuses at this time

## 2021-11-05 NOTE — Discharge Instructions (Addendum)
Your blood work was reassuring.  If you have any new symptoms such as chest pain or shortness of breath please return to the emergency department.  Please follow-up with your primary care doctor and cardiologist.

## 2021-11-05 NOTE — ED Triage Notes (Signed)
Pt comes into the ED via EMS from home with c/o "fainting", caregiver reports pt was feeling faint and assisted to the bed and went to call for help and when she returned the pt was shaking all over, turning purple, lasted 10-15 sec. Pt a/ox2, hx of dementia, hasnt been eating and drinking well recently  Prolong QTC SB 54-55 101/45 laying 82/50 sitting #20g RAC infusing [id number] 97.7 90-91onRA 95% on 2L [place]

## 2021-11-05 NOTE — ED Provider Notes (Signed)
Patient signed out to me at 3 PM pending troponins and UA and reassessment.  She is an 88 year old female that is witnessed syncopal episode today.  She is otherwise back to baseline asymptomatic vitals are within normal limits.  Patient's troponins x2 are negative.  EKG does have some nonspecific T wave inversions and we have no prior to compare to.  On reassessment patient is asymptomatic denies chest pain dyspnea and did not have any any symptoms preceding the event.  Her daughter says she has a syncopal episode about once per year.  She does have a cardiologist who she is seeing.  Given she is back to baseline with negative troponins and no ischemic symptoms I think she is appropriate for outpatient follow-up and family feels comfortable with this.  UA has no signs of infection.  Recommended they follow-up with cardiology return to the ED for any new chest pain or dyspnea.   Georga Hacking, MD 11/05/21 2350

## 2021-11-05 NOTE — ED Notes (Signed)
Daughter Lupita Leash Faucet and pt  give verbal consent to DC

## 2021-11-05 NOTE — ED Provider Notes (Signed)
[facility] Provider Note    Event Date/Time   First MD Initiated Contact with Patient 11/05/21 1424     (approximate)   History   Seizures and Loss of Consciousness  EM caveat: Dementia HPI  Diana Morrison is a 88 y.o. female who on review of primary care notes has a history of hypercholesterolemia, dementia  Today she was in her normal health, she was with her caretaker.  Patient cannot provide sustained history due to dementia.  Daughter provides majority of history but was not present at the time of the event  Was with her caretaker having a normal day, had had a meal.  They were at the home and she suddenly started to feel weak, and was then witnessed to slump forward but not fall and passed out for about 10 seconds.  She did have some brief shaking that occurred during the episode, but daughter reports that she does not think it was a seizure.  Does not have a history of seizure disorder.  Daughter reports that about once a year for the last several years similar episodes will occur and she has passed out roughly once a year in similar fashion but has never come to the ER.  She did not urinate on herself she did not bite her tongue     Physical Exam   Triage Vital Signs: ED Triage Vitals  Enc Vitals Group     BP 11/05/21 1231 106/66     Pulse Rate 11/05/21 1231 (!) 59     Resp 11/05/21 1231 16     Temp 11/05/21 1231 97.7 F (36.5 C)     Temp Source 11/05/21 1231 Oral     SpO2 11/05/21 1231 91 %     Weight 11/05/21 1227 128 lb 1.4 oz (58.1 kg)     Height 11/05/21 1227 5\' 3"  (1.6 m)     Head Circumference --      Peak Flow --      Pain Score 11/05/21 1227 0     Pain Loc --      Pain Edu? --      Excl. in GC? --     Most recent vital signs: Vitals:   11/05/21 1231 11/05/21 1536  BP: 106/66 140/85  Pulse: (!) 59 (!) 58  Resp: 16 15  Temp: 97.7 F (36.5 C)   SpO2: 91% 98%     General: Awake, no distress.  Very pleasant.  Daughter at  bedside.  Daughter reports she is patient's healthcare power of attorney.  Reports she feels fine and has no complaints.  Specifically denies headache chest pain trouble breathing abdominal pain. (Patient does have dementia) but she appears to be in no distress resting comfortably CV:  Good peripheral perfusion.  Normal heart tones and rhythm Resp:  Normal effort.  Clear bilaterally with normal work of breathing Abd:  No distention.  Soft nontender nondistended all quadrant Other:  No lower extremity edema.  Smiles with equal facial expression.  Moves all extremities to command.  5 out of 5 strength in all extremities.  No pronator drift in any extremity.  Clear speech   ED Results / Procedures / Treatments   Labs (all labs ordered are listed, but only abnormal results are displayed) Labs Reviewed  BASIC METABOLIC PANEL - Abnormal; Notable for the following components:      Result Value   Glucose, Bld 143 (*)    GFR, Estimated 57 (*)  All other components within normal limits  CBC  URINALYSIS, ROUTINE W REFLEX MICROSCOPIC  CBG MONITORING, ED  TROPONIN I (HIGH SENSITIVITY)  TROPONIN I (HIGH SENSITIVITY)     EKG  Interpreted by me at 1235 heart rate 60 QRS 70 QTc 400 normal sinus rhythm, mild nonspecific T wave abnormality seen in lateral precordial leads.  No previous for comparison   RADIOLOGY  Patient without central neurologic symptoms.  No acute pulmonary symptoms.  They do not see indication for imaging at this time.  No history of trauma or injury.   PROCEDURES:  Critical Care performed: No  Procedures   MEDICATIONS ORDERED IN ED: Medications  sodium chloride 0.9 % bolus 1,000 mL (1,000 mLs Intravenous New Bag/Given 11/05/21 1539)     IMPRESSION / MDM / ASSESSMENT AND PLAN / ED COURSE  I reviewed the triage vital signs and the nursing notes.                              Differential diagnosis includes, but is not limited to, syncopal episode.  Patient's  daughter reports that about a week ago she had several days of loose stool and they felt like she may be somewhat dehydrated, but that has improved.  She had a witnessed syncopal episode today but was in her normal health proceeding.  Differential diagnosis includes etiologies such as orthostatic, vasovagal, hypotensive episode, arrhythmia, ACS cardiac ischemia, no clinical signs or symptoms of be suggestive of pulmonary embolism, pneumothorax, acute pulmonary process, acute anemia, bleeding or severe vascular depletion at this time.  Patient's presentation is most consistent with acute presentation with potential threat to life or bodily function.  The patient is on the cardiac monitor to evaluate for evidence of arrhythmia and/or significant heart rate changes.  Discussed with patient and daughter, further work-up pending.  Dr. Sidney Ace assuming care pending at this time are serial troponins, and reassessment after receiving fluid bolus.  Urinalysis pending I think a reasonable disposition would include possible observation versus discharge based on discussion and indications by healthcare power of attorney who is the patient's daughter.  At that juncture that I left conversation, I did discuss with the daughter and she feels like little would probably be comfortable taking the patient home but we did discuss that observation overnight would help rule out causes such as heart related problem, rhythm changes, cardiac arrhythmia etc.  Final disposition pending work-up and reevaluation.     FINAL CLINICAL IMPRESSION(S) / ED DIAGNOSES   Final diagnoses:  Syncope and collapse     Rx / DC Orders   ED Discharge Orders     None        Note:  This document was prepared using Dragon voice recognition software and may include unintentional dictation errors.   Sharyn Creamer, MD 11/05/21 1700

## 2021-11-07 ENCOUNTER — Other Ambulatory Visit: Payer: Self-pay

## 2021-11-07 ENCOUNTER — Emergency Department: Payer: Medicare PPO

## 2021-11-07 ENCOUNTER — Emergency Department
Admission: EM | Admit: 2021-11-07 | Discharge: 2021-11-07 | Disposition: A | Discharge status: Home / Self Care | Payer: Medicare PPO | Attending: Emergency Medicine | Admitting: Emergency Medicine

## 2021-11-07 DIAGNOSIS — E041 Nontoxic single thyroid nodule: Secondary | ICD-10-CM | POA: Diagnosis not present

## 2021-11-07 DIAGNOSIS — F0390 Unspecified dementia without behavioral disturbance: Secondary | ICD-10-CM | POA: Diagnosis not present

## 2021-11-07 DIAGNOSIS — R55 Syncope and collapse: Secondary | ICD-10-CM | POA: Diagnosis not present

## 2021-11-07 LAB — BASIC METABOLIC PANEL
Anion gap: 5 (ref 5–15)
BUN: 13 mg/dL (ref 8–23)
CO2: 25 mmol/L (ref 22–32)
Calcium: 8.5 mg/dL — ABNORMAL LOW (ref 8.9–10.3)
Chloride: 108 mmol/L (ref 98–111)
Creatinine, Ser: 0.94 mg/dL (ref 0.44–1.00)
GFR, Estimated: 59 mL/min — ABNORMAL LOW (ref >60)
Glucose, Bld: 110 mg/dL — ABNORMAL HIGH (ref 70–99)
Potassium: 3.3 mmol/L — ABNORMAL LOW (ref 3.5–5.1)
Sodium: 138 mmol/L (ref 135–145)

## 2021-11-07 LAB — CBC
HCT: 39.7 % (ref 36.0–46.0)
Hemoglobin: 13 g/dL (ref 12.0–15.0)
MCH: 31.6 pg (ref 26.0–34.0)
MCHC: 32.7 g/dL (ref 30.0–36.0)
MCV: 96.4 fL (ref 80.0–100.0)
Platelets: 177 10*3/uL (ref 150–400)
RBC: 4.12 MIL/uL (ref 3.87–5.11)
RDW: 12.3 % (ref 11.5–15.5)
WBC: 8.8 10*3/uL (ref 4.0–10.5)
nRBC: 0 % (ref 0.0–0.2)

## 2021-11-07 LAB — URINALYSIS, ROUTINE W REFLEX MICROSCOPIC
Bilirubin Urine: NEGATIVE
Glucose, UA: NEGATIVE mg/dL
Hgb urine dipstick: NEGATIVE
Ketones, ur: NEGATIVE mg/dL
Leukocytes,Ua: NEGATIVE
Nitrite: NEGATIVE
Protein, ur: 30 mg/dL — AB
Specific Gravity, Urine: 1.014 (ref 1.005–1.030)
pH: 5 (ref 5.0–8.0)

## 2021-11-07 LAB — D-DIMER, QUANTITATIVE: D-Dimer, Quant: 1.1 ug/mL-FEU — ABNORMAL HIGH (ref 0.00–0.50)

## 2021-11-07 LAB — CBG MONITORING, ED: Glucose-Capillary: 99 mg/dL (ref 70–99)

## 2021-11-07 MED ORDER — IOHEXOL 350 MG/ML SOLN
75.0000 mL | Freq: Once | INTRAVENOUS | Status: AC | PRN
  Administered 2021-11-07: 75 mL via INTRAVENOUS

## 2021-11-07 MED ORDER — SODIUM CHLORIDE 0.9 % IV BOLUS
500.0000 mL | Freq: Once | INTRAVENOUS | Status: AC
  Administered 2021-11-07: 500 mL via INTRAVENOUS

## 2021-11-07 MED ORDER — POTASSIUM CHLORIDE 20 MEQ PO PACK
40.0000 meq | PACK | ORAL | Status: AC
  Administered 2021-11-07: 40 meq via ORAL
  Filled 2021-11-07: qty 2

## 2021-11-07 NOTE — ED Provider Notes (Signed)
[facility] Provider Note    Event Date/Time   First MD Initiated Contact with Patient 11/07/21 1739     (approximate)   History   No chief complaint on file.   HPI  Diana Morrison is a 88 y.o. female who has a history of dementia hypercholesterolemia  At about 1230 she was in her normal health, she was outside seated when she was noted to kind of briefly passed out.  She did not fall or become injured.   Patient recovered quickly.  Patient reports she feels perfectly fine patient denies headache chest pain trouble breathing or any other symptoms.  Daughter reports that her mother is acting behaving very normally at this time.  She has not had any recent illness other than when she passed out also a few days ago.  They probably would not of come to the hospital given her goals of care, but it occurred in the presence of her aide and her daughter was not there so 911 was called.    Physical Exam   Triage Vital Signs: ED Triage Vitals [11/07/21 1356]  Enc Vitals Group     BP (!) 97/57     Pulse Rate 60     Resp 16     Temp 98.7 F (37.1 C)     Temp Source Oral     SpO2 92 %     Weight      Height      Head Circumference      Peak Flow      Pain Score      Pain Loc      Pain Edu?      Excl. in GC?     Most recent vital signs: Vitals:   11/07/21 2130 11/07/21 2200  BP: (!) 146/113 (!) 141/84  Pulse: 66 (!) 58  Resp: 11 15  Temp:    SpO2: 100% 98%     General: Awake, no distress.  She is oriented to self and daughter but not to date.  Pleasant CV:  Good peripheral perfusion.  Moderate systolic ejection murmur, daughter reports this is a known murmur. Resp:  Normal effort.  Clear bilaterally normal work of breathing and no distress Abd:  No distention.  Soft nontender nondistended Other:  5 out of 5 strength in all extremities.  Normal facial expressions.  Clear speech and content.  No focal deficits.  Patient able to sit up on her own in  the hospital bed without difficulty for examination   ED Results / Procedures / Treatments   Labs (all labs ordered are listed, but only abnormal results are displayed) Labs Reviewed  BASIC METABOLIC PANEL - Abnormal; Notable for the following components:      Result Value   Potassium 3.3 (*)    Glucose, Bld 110 (*)    Calcium 8.5 (*)    GFR, Estimated 59 (*)    All other components within normal limits  URINALYSIS, ROUTINE W REFLEX MICROSCOPIC - Abnormal; Notable for the following components:   Color, Urine YELLOW (*)    APPearance HAZY (*)    Protein, ur 30 (*)    Bacteria, UA RARE (*)    All other components within normal limits  D-DIMER, QUANTITATIVE - Abnormal; Notable for the following components:   D-Dimer, Quant 1.10 (*)    All other components within normal limits  URINE CULTURE  CBC  CBG MONITORING, ED  CBG MONITORING, ED  EKG  Interpreted by me at 1400 heart rate 60 QRS 70 QTc 470 normal sinus rhythm, very slight nonspecific T wave abnormality seen in lateral precordial and anterior precordial leads.  No STEMI   RADIOLOGY CT Angio Chest PE W and/or Wo Contrast  Result Date: 11/07/2021 CLINICAL DATA:  Syncope. EXAM: CT ANGIOGRAPHY CHEST WITH CONTRAST TECHNIQUE: Multidetector CT imaging of the chest was performed using the standard protocol during bolus administration of intravenous contrast. Multiplanar CT image reconstructions and MIPs were obtained to evaluate the vascular anatomy. RADIATION DOSE REDUCTION: This exam was performed according to the departmental dose-optimization program which includes automated exposure control, adjustment of the mA and/or kV according to patient size and/or use of iterative reconstruction technique. CONTRAST:  67mL OMNIPAQUE IOHEXOL 350 MG/ML SOLN COMPARISON:  None Available. FINDINGS: Cardiovascular: There is marked severity calcification of the aortic arch and descending thoracic aorta, without evidence of aortic aneurysm or  dissection. Satisfactory opacification of the pulmonary arteries to the segmental level. No evidence of pulmonary embolism. Normal heart size with marked severity coronary artery calcification. No pericardial effusion. Mediastinum/Nodes: No enlarged mediastinal, hilar, or axillary lymph nodes. 14 mm and 8 mm heterogeneous thyroid nodules are seen within the left lobe of the thyroid gland. The trachea and esophagus demonstrate no significant findings. Lungs/Pleura: Mild posteromedial left upper lobe and right lower lobe linear scarring and atelectasis is seen. There is no evidence of acute infiltrate, pleural effusion or pneumothorax. Upper Abdomen: No acute abnormality. Musculoskeletal: No chest wall abnormality. No acute or significant osseous findings. Review of the MIP images confirms the above findings. IMPRESSION: 1. No evidence of pulmonary embolism or other acute intrathoracic process. 2. Marked severity coronary artery disease. 3. Incidental thyroid nodules within the left lobe of the thyroid gland. No follow-up imaging is recommended. Reference: J Am Coll Radiol. 2015 Feb;12(2): 143-50 Aortic Atherosclerosis (ICD10-I70.0). Electronically Signed   By: Aram Candela M.D.   On: 11/07/2021 21:21   DG Chest Port 1 View  Result Date: 11/07/2021 CLINICAL DATA:  Syncope EXAM: PORTABLE CHEST 1 VIEW COMPARISON:  None Available. FINDINGS: Mild elevation right diaphragm. Subsegmental atelectasis or scarring at the bases. No consolidation or effusion. Cardiac size within normal limits IMPRESSION: Mild elevation of right diaphragm with subsegmental atelectasis or scar at the bases Electronically Signed   By: Jasmine Pang M.D.   On: 11/07/2021 18:33   CT Head Wo Contrast  Result Date: 11/07/2021 CLINICAL DATA:  Syncope EXAM: CT HEAD WITHOUT CONTRAST TECHNIQUE: Contiguous axial images were obtained from the base of the skull through the vertex without intravenous contrast. RADIATION DOSE REDUCTION: This exam  was performed according to the departmental dose-optimization program which includes automated exposure control, adjustment of the mA and/or kV according to patient size and/or use of iterative reconstruction technique. COMPARISON:  MRI 08/09/2014 FINDINGS: Brain: No hemorrhage is visualized. Moderate atrophy. Chronic small vessel ischemic changes of the white matter. Asymmetric hypodensity within the left occipital white matter, for example series 2 image 12 through 15. Nonenlarged ventricles. Vascular: No hyperdense vessels.  Carotid vascular calcification Skull: Normal. Negative for fracture or focal lesion. Sinuses/Orbits: No acute finding. Other: None IMPRESSION: 1. Atrophy and chronic small vessel ischemic changes of the white matter. Asymmetrical hypodensity within the left occipital white matter, uncertain if this is due to asymmetric white matter disease/old infarct versus edema. Suggest MRI for further evaluation. Electronically Signed   By: Jasmine Pang M.D.   On: 11/07/2021 18:32  PROCEDURES:  Critical Care performed: No  Procedures   MEDICATIONS ORDERED IN ED: Medications  sodium chloride 0.9 % bolus 500 mL (0 mLs Intravenous Stopped 11/07/21 1939)  potassium chloride (KLOR-CON) packet 40 mEq (40 mEq Oral Given 11/07/21 1936)  iohexol (OMNIPAQUE) 350 MG/ML injection 75 mL (75 mLs Intravenous Contrast Given 11/07/21 2107)     IMPRESSION / MDM / ASSESSMENT AND PLAN / ED COURSE  I reviewed the triage vital signs and the nursing notes.                              Differential diagnosis includes, but is not limited to, recurrent syncopal episode, may be vasovagal in nature as she initially had hypotension but now normotensive, she has had 2 episodes now in the last few days.  Other considerations certainly called other causes syncope, anemia, ACS, arrhythmia, pulmonary embolism, pneumothorax, etc.  In discussion with the patient's daughter who is also healthcare power of  attorney there is clear desire to have a DO NOT RESUSCITATE status and to minimize need for hospitalization and advanced heart testing.  Daughter is similar to previous is apprehensive to have patient hospitalized.  At the present time feels as though she would like to be able to take her home soon and that comfort being priority in her care over additional testing and would be highly unlikely to select any sort of surgical or operative procedures if because is revealed that would need any sort of surgery such as heart valve issue etc.  I will check a D-dimer given recurrent episodes of syncope though there is no associated hypoxia tachycardia or dyspnea, but I do not feel that troponin is required at this time.  There is no associated chest pains.  Just had recent troponin serially evaluate for same.  We will give IV fluids and then discuss further goals of care with patient's healthcare power of attorney  Patient's presentation is most consistent with acute presentation with potential threat to life or bodily function.  The patient is on the cardiac monitor to evaluate for evidence of arrhythmia and/or significant heart rate changes.  I discussed benefits of admission including further testing such as echocardiogram to further evaluate her heart valve with known murmur, evaluate for carotid stenosis, telemetry monitoring evaluation for arrhythmias or other causes of syncope.  We discussed this with her healthcare power of attorney is her daughter at the bedside.  She will continue to consider and is talked with her family, but is leaning towards likely taking patient home to continue with goals of care for comfort.  Daughter reporting probably would not of brought her to the hospital today except that when she passed out it was with in the presence of an aide    Labs reviewed normal CBC normal metabolic panel except for slight hypokalemia.  Urinalysis without evidence of infection  D-dimer, chest  x-ray head CT all reviewed by me   CT of the chest negative for PE.  Noted are thyroid nodules and coronary disease.  Discussed this with the patient's daughter at the bedside, they will follow-up with Dr. Laural Benes but doubtful that they will further pursue these issues at this time given goals of care  Patient resting comfortably requesting discharge.  Had a lengthy discussion with healthcare power of attorney as well as the patient, and goals of care at this point are not to be hospitalized.  We did discuss further treatment such  as MRI obtain echocardiogram, ruling out a stroke, checking for heart valve issues or or issues with the carotid arteries etc. that could help better diagnose and call find cause for her episodes of passing out.  However it is very clear to me that goals of care for her are to be primarily at home, and family does not wish to pursue further treatment or admission at this time.  Daughter comfortable taking her home and she has home health care needs.  I think this is a very reasonable decision and in keeping with appropriate wishes for the family and as directed by her healthcare power of attorney  Return precautions and treatment recommendations and follow-up discussed with the patient who is agreeable with the plan.   FINAL CLINICAL IMPRESSION(S) / ED DIAGNOSES   Final diagnoses:  Syncope, unspecified syncope type  Thyroid nodule     Rx / DC Orders   ED Discharge Orders     None        Note:  This document was prepared using Dragon voice recognition software and may include unintentional dictation errors.   Sharyn Creamer, MD 11/07/21 2220

## 2021-11-07 NOTE — ED Notes (Signed)
First Nurse Note: Pt to ED via [facility] for syncopal episode while sitting down. Pt had 1 episode of vomiting. Pt has hx/o dementia.  110/60 18 G IV in LAC, pt was given 450 cc of NS HR: 58 91% on room air CBG- 122 Temp: 97.1

## 2021-11-07 NOTE — ED Notes (Signed)
Patient transported to CT 

## 2021-11-07 NOTE — ED Provider Triage Note (Signed)
Emergency Medicine Provider Triage Evaluation Note  TRIVIA HEFFELFINGER , a 88 y.o. female  was evaluated in triage.  Pt complains of syncopal episode while sitting on the porch. Witnessed by caregiver. Similar episode last week and was evaluated here. Patient denies chest pain, headache, or abdominal pain.  Physical Exam  BP (!) 97/57 (BP Location: Left Arm)   Pulse 60   Temp 98.7 F (37.1 C) (Oral)   Resp 16   SpO2 92%  Gen:   Awake, no distress   Resp:  Normal effort  MSK:   Moves extremities without difficulty  Other:    Medical Decision Making  Medically screening exam initiated at 2:31 PM.  Appropriate orders placed.  ERMELINDA ECKERT was informed that the remainder of the evaluation will be completed by another provider, this initial triage assessment does not replace that evaluation, and the importance of remaining in the ED until their evaluation is complete.   Chinita Pester, FNP 11/07/21 1432

## 2021-11-07 NOTE — ED Notes (Addendum)
Pt states she is "ready to go"  Feels fine now.  Pt daughter states she would like to discuss with EDP when they should bring her in, as she has episodes like this infrequently and then recovers.  Caregiver made decision to call EMS today.

## 2021-11-09 LAB — URINE CULTURE

## 2021-12-10 ENCOUNTER — Telehealth: Payer: Self-pay | Admitting: Nurse Practitioner

## 2021-12-10 NOTE — Telephone Encounter (Signed)
Spoke with daughter Lupita Leash and we discussed Palliative referral/services and she was in agreement with beginning services.  I have scheduled a MyChart Consult for 12/25/21 @ 2:30 PM

## 2021-12-12 ENCOUNTER — Emergency Department
Admission: EM | Admit: 2021-12-12 | Discharge: 2021-12-13 | Disposition: A | Discharge status: Home / Self Care | Payer: Medicare PPO | Attending: Emergency Medicine | Admitting: Emergency Medicine

## 2021-12-12 ENCOUNTER — Emergency Department: Payer: Medicare PPO

## 2021-12-12 ENCOUNTER — Other Ambulatory Visit: Payer: Self-pay

## 2021-12-12 DIAGNOSIS — F0390 Unspecified dementia without behavioral disturbance: Secondary | ICD-10-CM | POA: Insufficient documentation

## 2021-12-12 DIAGNOSIS — R001 Bradycardia, unspecified: Secondary | ICD-10-CM | POA: Insufficient documentation

## 2021-12-12 DIAGNOSIS — S0101XA Laceration without foreign body of scalp, initial encounter: Secondary | ICD-10-CM | POA: Insufficient documentation

## 2021-12-12 DIAGNOSIS — E876 Hypokalemia: Secondary | ICD-10-CM | POA: Insufficient documentation

## 2021-12-12 DIAGNOSIS — R55 Syncope and collapse: Secondary | ICD-10-CM | POA: Insufficient documentation

## 2021-12-12 DIAGNOSIS — W19XXXA Unspecified fall, initial encounter: Secondary | ICD-10-CM

## 2021-12-12 DIAGNOSIS — S0990XA Unspecified injury of head, initial encounter: Secondary | ICD-10-CM | POA: Diagnosis present

## 2021-12-12 DIAGNOSIS — W01198A Fall on same level from slipping, tripping and stumbling with subsequent striking against other object, initial encounter: Secondary | ICD-10-CM | POA: Diagnosis not present

## 2021-12-12 LAB — BASIC METABOLIC PANEL
Anion gap: 3 — ABNORMAL LOW (ref 5–15)
BUN: 10 mg/dL (ref 8–23)
CO2: 17 mmol/L — ABNORMAL LOW (ref 22–32)
Calcium: 5.5 mg/dL — CL (ref 8.9–10.3)
Chloride: 124 mmol/L — ABNORMAL HIGH (ref 98–111)
Creatinine, Ser: 0.51 mg/dL (ref 0.44–1.00)
GFR, Estimated: >60 mL/min (ref >60)
Glucose, Bld: 67 mg/dL — ABNORMAL LOW (ref 70–99)
Potassium: 2.7 mmol/L — CL (ref 3.5–5.1)
Sodium: 144 mmol/L (ref 135–145)

## 2021-12-12 LAB — MAGNESIUM: Magnesium: 2 mg/dL (ref 1.7–2.4)

## 2021-12-12 LAB — URINALYSIS, COMPLETE (UACMP) WITH MICROSCOPIC
Bilirubin Urine: NEGATIVE
Glucose, UA: NEGATIVE mg/dL
Hgb urine dipstick: NEGATIVE
Ketones, ur: NEGATIVE mg/dL
Nitrite: NEGATIVE
Protein, ur: NEGATIVE mg/dL
Specific Gravity, Urine: 1.005 (ref 1.005–1.030)
pH: 5 (ref 5.0–8.0)

## 2021-12-12 LAB — CBC
HCT: 38.1 % (ref 36.0–46.0)
Hemoglobin: 12.4 g/dL (ref 12.0–15.0)
MCH: 31.8 pg (ref 26.0–34.0)
MCHC: 32.5 g/dL (ref 30.0–36.0)
MCV: 97.7 fL (ref 80.0–100.0)
Platelets: 177 10*3/uL (ref 150–400)
RBC: 3.9 MIL/uL (ref 3.87–5.11)
RDW: 12.1 % (ref 11.5–15.5)
WBC: 6.7 10*3/uL (ref 4.0–10.5)
nRBC: 0 % (ref 0.0–0.2)

## 2021-12-12 LAB — TSH: TSH: 1.605 u[IU]/mL (ref 0.350–4.500)

## 2021-12-12 LAB — TROPONIN I (HIGH SENSITIVITY)
Troponin I (High Sensitivity): 5 ng/L (ref <18)
Troponin I (High Sensitivity): 7 ng/L (ref <18)

## 2021-12-12 LAB — PHOSPHORUS: Phosphorus: 3.1 mg/dL (ref 2.5–4.6)

## 2021-12-12 MED ORDER — CALCIUM GLUCONATE-NACL 1-0.675 GM/50ML-% IV SOLN: 1.0000 g | Freq: Once | INTRAVENOUS | Status: DC

## 2021-12-12 MED ORDER — POTASSIUM CHLORIDE 10 MEQ/100ML IV SOLN: 10.0000 meq | Freq: Once | INTRAVENOUS | Status: DC

## 2021-12-12 MED ORDER — POTASSIUM CHLORIDE CRYS ER 20 MEQ PO TBCR
40.0000 meq | EXTENDED_RELEASE_TABLET | Freq: Once | ORAL | Status: AC
  Administered 2021-12-13: 40 meq via ORAL
  Filled 2021-12-12: qty 2

## 2021-12-12 NOTE — ED Provider Notes (Signed)
[facility] Provider Note    Event Date/Time   First MD Initiated Contact with Patient 12/12/21 2259     (approximate)   History   Fall   HPI {Remember to add pertinent medical, surgical, social, and/or OB history to HPI:1} Diana Morrison is a 88 y.o. female  ***       Physical Exam   Triage Vital Signs: ED Triage Vitals  Enc Vitals Group     BP 12/12/21 1920 (!) 147/92     Pulse Rate 12/12/21 1920 67     Resp 12/12/21 1920 18     Temp 12/12/21 1920 (!) 97.1 F (36.2 C)     Temp Source 12/12/21 1920 Axillary     SpO2 12/12/21 1920 94 %     Weight 12/12/21 1921 64.9 kg (143 lb)     Height 12/12/21 1921 1.575 m (5\' 2" )     Head Circumference --      Peak Flow --      Pain Score --      Pain Loc --      Pain Edu? --      Excl. in GC? --     Most recent vital signs: Vitals:   12/12/21 1920 12/12/21 2256  BP: (!) 147/92 137/72  Pulse: 67 (!) 59  Resp: 18 18  Temp: (!) 97.1 F (36.2 C) 97.7 F (36.5 C)  SpO2: 94% 95%    {Only need to document appropriate and relevant physical exam:1} General: Awake, no distress. *** CV:  Good peripheral perfusion. *** Resp:  Normal effort. *** Abd:  No distention. *** Other:  ***   ED Results / Procedures / Treatments   Labs (all labs ordered are listed, but only abnormal results are displayed) Labs Reviewed  BASIC METABOLIC PANEL - Abnormal; Notable for the following components:      Result Value   Potassium 2.7 (*)    Chloride 124 (*)    CO2 17 (*)    Glucose, Bld 67 (*)    Calcium 5.5 (*)    Anion gap 3 (*)    All other components within normal limits  CBC  URINALYSIS, COMPLETE (UACMP) WITH MICROSCOPIC  MAGNESIUM  PHOSPHORUS  TSH  TROPONIN I (HIGH SENSITIVITY)  TROPONIN I (HIGH SENSITIVITY)     EKG  ED ECG REPORT I, 02/11/22, the attending physician, personally viewed and interpreted this ECG.  Date: 12/12/2021 EKG Time: 19: 27 Rate: 65 Rhythm: normal sinus  rhythm QRS Axis: normal Intervals: normal ST/T Wave abnormalities: Non-specific ST segment / T-wave changes, but no clear evidence of acute ischemia. Narrative Interpretation: no definitive evidence of acute ischemia; does not meet STEMI criteria.    RADIOLOGY *** {USE THE WORD "INTERPRETED"!! You MUST document your own interpretation of imaging, as well as the fact that you reviewed the radiologist's report!:1}   PROCEDURES:  Critical Care performed: {CriticalCareYesNo:19197::"Yes, see critical care procedure note(s)","No"}  Procedures   MEDICATIONS ORDERED IN ED: Medications - No data to display   IMPRESSION / MDM / ASSESSMENT AND PLAN / ED COURSE  I reviewed the triage vital signs and the nursing notes.                              Differential diagnosis includes, but is not limited to, ***  Patient's presentation is most consistent with {EM COPA:27473}  {If the patient is on the monitor, remove the  brackets and asterisks on the sentence below and remember to document it as a Procedure as well. Otherwise delete the sentence below:1} {**The patient is on the cardiac monitor to evaluate for evidence of arrhythmia and/or significant heart rate changes.**} {Remember to include, when applicable, any/all of the following data: independent review of imaging independent review of labs (comment specifically on pertinent positives and negatives) review of specific prior hospitalizations, PCP/specialist notes, etc. discuss meds given and prescribed document any discussion with consultants (including hospitalists) any clinical decision tools you used and why (PECARN, NEXUS, etc.) did you consider admitting the patient? document social determinants of health affecting patient's care (homelessness, inability to follow up in a timely fashion, etc) document any pre-existing conditions increasing risk on current visit (e.g. diabetes and HTN increasing danger of high-risk chest  pain/ACS) describes what meds you gave (especially parenteral) and why any other interventions?:1}     FINAL CLINICAL IMPRESSION(S) / ED DIAGNOSES   Final diagnoses:  None     Rx / DC Orders   ED Discharge Orders     None        Note:  This document was prepared using Dragon voice recognition software and may include unintentional dictation errors.

## 2021-12-12 NOTE — ED Triage Notes (Signed)
[place] fall [place]. Per daughter pt has recently been seen for syncope vs. Seizure with fall. Per daughter her caregiver was standing with pt and pt fell backwards. Pt with laceration noted to posterior skull. Pt is not able to articulate if syncope occurred.

## 2021-12-13 LAB — BASIC METABOLIC PANEL
Anion gap: 9 (ref 5–15)
BUN: 14 mg/dL (ref 8–23)
CO2: 24 mmol/L (ref 22–32)
Calcium: 8.7 mg/dL — ABNORMAL LOW (ref 8.9–10.3)
Chloride: 107 mmol/L (ref 98–111)
Creatinine, Ser: 0.71 mg/dL (ref 0.44–1.00)
GFR, Estimated: >60 mL/min (ref >60)
Glucose, Bld: 99 mg/dL (ref 70–99)
Potassium: 3.3 mmol/L — ABNORMAL LOW (ref 3.5–5.1)
Sodium: 140 mmol/L (ref 135–145)

## 2021-12-13 MED ORDER — LIDOCAINE-EPINEPHRINE-TETRACAINE (LET) TOPICAL GEL
3.0000 mL | Freq: Once | TOPICAL | Status: AC
  Administered 2021-12-13: 3 mL via TOPICAL
  Filled 2021-12-13: qty 3

## 2021-12-13 NOTE — ED Notes (Signed)
E-signature pad unavailable - Pt & Family verbalized understanding of D/C information - no additional concerns at this time.  

## 2021-12-13 NOTE — Discharge Instructions (Addendum)
As we discussed, Diana Morrison fortunately does not have any significant injuries or substantial lab abnormalities that require her to stay in the hospital.  We discussed admission, but agreed that she would likely be more comfortable at home, particularly given that this is not the first episode of passing out and falling.  Please follow-up with her primary care doctor in about a week.  Return to the emergency department if you develop new or worsening symptoms that concern you.

## 2021-12-14 LAB — THYROID PANEL WITH TSH
Free Thyroxine Index: 2.2 (ref 1.2–4.9)
T3 Uptake Ratio: 27 % (ref 24–39)
T4, Total: 8.2 ug/dL (ref 4.5–12.0)
TSH: 1.43 u[IU]/mL (ref 0.450–4.500)

## 2021-12-14 LAB — PARATHYROID HORMONE, INTACT (NO CA): PTH: 22 pg/mL (ref 15–65)

## 2021-12-25 ENCOUNTER — Encounter: Payer: Self-pay | Admitting: Nurse Practitioner

## 2021-12-25 ENCOUNTER — Telehealth: Payer: Medicare PPO | Admitting: Nurse Practitioner

## 2021-12-25 DIAGNOSIS — Z515 Encounter for palliative care: Secondary | ICD-10-CM

## 2021-12-25 DIAGNOSIS — G309 Alzheimer's disease, unspecified: Secondary | ICD-10-CM

## 2021-12-25 NOTE — Progress Notes (Signed)
Therapist, nutritional Palliative Care Consult Note Telephone: [phone]  Fax: [phone]   Date of encounter: 12/25/21 3:03 PM PATIENT NAME: Diana Morrison [address] [place] [address]   [phone] (home)  DOB: 1932-10-30 MRN: [id number] PRIMARY CARE PROVIDER:    Gracelyn Nurse, MD,  [address] [place] [address] [phone]  REFERRING PROVIDER:   Gracelyn Nurse, MD [address] [place],  [place] [address] [phone]  RESPONSIBLE PARTY:    Contact Information     Name Relation Home Work [place] Daughter [phone]  [phone]      Due to the COVID-19 crisis, this visit was done via telemedicine from my office and it was initiated and consent by this patient and or family.  I connected with Diana Morrison daughter with  Diana Morrison on 12/25/21 by a video enabled telemedicine application and verified that I am speaking with the correct person using two identifiers.   I discussed the limitations of evaluation and management by telemedicine. The patient expressed understanding and agreed to proceed.  Palliative Care was asked to follow this patient by consultation request of  Diana Nurse, MD to address advance care planning and complex medical decision making. This is the initial visit.                          ASSESSMENT AND PLAN / RECOMMENDATIONS:  Advance Care Planning/Goals of Care: Goals include to maximize quality of life and symptom management. Patient/health care surrogate gave his/her permission to discuss.Our advance care planning conversation included a discussion about:    The value and importance of advance care planning  Experiences with loved ones who have been seriously ill or have died  Exploration of personal, cultural or spiritual beliefs that might influence medical decisions  Exploration of goals of care in the event of a sudden injury or illness  Identification  of  a healthcare agent  Review and updating or creation of an  advance directive document . Decision not to resuscitate or to de-escalate disease focused treatments due to poor prognosis. CODE STATUS: DNR  Symptom Management/Plan: 1. Advance Care Planning;    2. Goals of Care: Goals include to maximize quality of life and symptom management. Our advance care planning conversation included a discussion about:    The value and importance of advance care planning  Exploration of personal, cultural or spiritual beliefs that might influence medical decisions  Exploration of goals of care in the event of a sudden injury or illness  Identification and preparation of a healthcare agent  Review and updating or creation of an advance directive document.  3. Alzheimer's/vascular dementia stable, discussed progression, expectations. We talked about with recent d/c memantine appears more active. Continue to monitor  Continue Abilify 2 mg by mouth once a day (daily)  Continue Donepezil (Aricept) 10 mg once a day Continue Escitalopram (Lexapro) 10 mg daily  07/01/2021 weight 152 lbs 12/08/2021 weight 143 lbs  CT Head without contrast 11/07/2021: Atrophy and chronic small vessel ischemic changes of the white matter. Asymmetrical hypodensity within the left occipital white matter, uncertain if this is due to asymmetric white matter disease/old infarct versus edema.   4.  Palliative care encounter; Palliative care encounter; Palliative medicine team will continue to support patient, patient's family, and medical team. Visit consisted of counseling and education dealing with the complex and emotionally intense issues of  symptom management and palliative care in the setting of serious and potentially life-threatening illness Follow up Palliative Care Visit: Palliative care will continue to follow for complex medical decision making, advance care planning, and clarification of goals. Return 8 weeks or prn.  I spent  35 minutes providing this consultation. More than 50% of the time in this consultation was spent in counseling and care coordination. PPS: 50% Chief Complaint: Initial palliative consult for complex medical decision making, address goals, manage ongoing symptoms  HISTORY OF PRESENT ILLNESS:  Diana Morrison is a 88 y.o. year old female  with multiple medical problems including Alzheimer/vascular dementia, HTN, HLD, transient leukopenia. I connected with Diana Morrison, Diana Morrison's daughter with Diana Morrison. We talked about the last time Diana Morrison has been independent, past medical history, cognitive, functional impairment, h/o falls. We talked about ros, appetite, weight. We talked about social, family reviewed. We talked about daily routine, has in home 24 hr caregivers. We talked about recent episode of syncope with caregivers. We talked about visit with Diana Morrison, medications. We talked about medical goals including DNR with [facility] in the home. We talked about role pc in poc. We talked about LTC planning, talked about PC SW, PACE program and f/u pc visit, Diana Morrison in agreement, scheduled. Therapeutic listening, emotional support provided.   History obtained from review of EMR, discussion with daughter, Diana Morrison with Diana Morrison.  I reviewed available labs, medications, imaging, studies and related documents from the EMR.  Records reviewed and summarized above.   ROS 10 point system reviewed all negative except HPI  Physical Exam: Deferred  PAST MEDICAL HISTORY:  Active Ambulatory Problems    Diagnosis Date Noted   No Active Ambulatory Problems   Resolved Ambulatory Problems    Diagnosis Date Noted   No Resolved Ambulatory Problems   Past Medical History:  Diagnosis Date   Hypercholesteremia    Hypertension    Mitral regurgitation    Vitamin deficiency    SOCIAL HX:  Social History   Tobacco Use   Smoking status: Never   Smokeless tobacco: Never  Substance Use Topics   Alcohol use: No   FAMILY HX:  History reviewed. No pertinent family history.    ALLERGIES: No Known Allergies   PERTINENT MEDICATIONS:  Outpatient Encounter Medications as of 12/25/2021  Medication Sig   amLODipine-atorvastatin (CADUET) 5-20 MG tablet Take 1 tablet by mouth daily. am   aspirin EC 81 MG tablet Take 81 mg by mouth daily. Am   brimonidine-timolol (COMBIGAN) 0.2-0.5 % ophthalmic solution Place 1 drop into the left eye every 12 (twelve) hours. Am and dinner   Cholecalciferol (VITAMIN D-3) 1000 units CAPS Take by mouth daily.   cyanocobalamin 500 MCG tablet Take 1,000 mcg by mouth daily. Am   donepezil (ARICEPT) 10 MG tablet Take 10 mg by mouth at bedtime. am   No facility-administered encounter medications on file as of 12/25/2021.   Thank you for the opportunity to participate in the care of Ms. Krajewski.  The palliative care team will continue to follow. Please call our office at [phone] if we can be of additional assistance.   Keshonna Valvo Z Olinda Nola, NP ,

## 2021-12-30 ENCOUNTER — Telehealth: Payer: Self-pay

## 2021-12-30 NOTE — Telephone Encounter (Signed)
420 pm.  Return call made at the request of [name], NP.  Spoke with daughter Lupita Leash who advised namenda was being tapered down and then would be discontinued.  Patient started to display some anxiety during this time and she was uncertain if this was related to the the decrease in namenda.  Lupita Leash has also reached out to PCP but has not heard back.  Secure message sent to Christin Reginold Agent, NP regarding above.

## 2022-01-01 ENCOUNTER — Telehealth: Payer: Self-pay

## 2022-01-01 NOTE — Telephone Encounter (Signed)
106 pm. Follow up call made to daughter Diana Morrison.  Anxiety has been minimal for patient this week.  No pacing reported by caregivers.  Advised that I had spoken to [name], NP regarding anxiety and possible correlation with decrease in Namenda.  Advised NP suggested melatonin 1 mg to calm patient if needed during the transition off of Namenda.  Diana Morrison advised she also has a video visit with neurology tomorrow.  She is appreciative of call back and has no new concerns at this time.

## 2022-02-19 ENCOUNTER — Encounter: Payer: Self-pay | Admitting: Nurse Practitioner

## 2022-02-19 ENCOUNTER — Other Ambulatory Visit: Payer: Medicare PPO | Admitting: Nurse Practitioner

## 2022-02-19 DIAGNOSIS — G309 Alzheimer's disease, unspecified: Secondary | ICD-10-CM

## 2022-02-19 DIAGNOSIS — Z515 Encounter for palliative care: Secondary | ICD-10-CM

## 2022-02-19 NOTE — Progress Notes (Signed)
[facility] Consult Note Telephone: [phone]  Fax: [phone]    Date of encounter: 02/19/22 1:26 PM PATIENT NAME: Diana Morrison [id number] [place]  [address]   [phone] (home)  DOB: 20-Jan-1933 MRN: [id number] PRIMARY CARE PROVIDER:    Baxter Hire, MD,  [place] [address] [phone]  RESPONSIBLE PARTY:    Contact Information     Name Relation Home Work [place] Daughter [phone]  [phone]       Due to the COVID-19 crisis, this visit was done via telemedicine from my office and it was initiated and consent by this patient and or family.   I connected with Diana Morrison daughter with  Diana Morrison OR [facility] on 12/25/21 by a video enabled telemedicine application and verified that I am speaking with the correct person using two identifiers.   I discussed the limitations of evaluation and management by telemedicine. The patient expressed understanding and agreed to proceed.  Palliative Care was asked to follow this patient by consultation request of  Baxter Hire, MD to address advance care planning and complex medical decision making. This is the initial visit.                          ASSESSMENT AND PLAN / RECOMMENDATIONS:  Symptom Management/Plan: 1. Advance Care Planning;  DNR Discussed PC NP to sign off as pc program changing and currently stable, will add to [facility] RN/PC SW to follow for further decline   2. Goals of Care: Goals include to maximize quality of life and symptom management. Our advance care planning conversation included a discussion about:    The value and importance of advance care planning  Exploration of personal, cultural or spiritual beliefs that might influence medical decisions  Exploration of goals of care in the event of a sudden injury or illness  Identification and preparation of a healthcare agent  Review and updating or creation  of an advance directive document.   3. Alzheimer's/vascular dementia stable, discussed progression, expectations. Continue to monitor 07/01/2021 weight 152 lbs 12/08/2021 weight 143 lbs  12/29/2021 weight 142 lbs No current weight reported 4.  Palliative care encounter; Palliative care encounter; Palliative medicine team will continue to support patient, patient's family, and medical team. Visit consisted of counseling and education dealing with the complex and emotionally intense issues of symptom management and palliative care in the setting of serious and potentially life-threatening illness   I spent 31 minutes providing this consultation. More than 50% of the time in this consultation was spent in counseling and care coordination. PPS: 50% Chief Complaint: Initial palliative consult for complex medical decision making, address goals, manage ongoing symptoms   HISTORY OF PRESENT ILLNESS:  Diana Morrison is a 88 y.o. year old female  with multiple medical problems including Alzheimer/vascular dementia, HTN, HLD, transient leukopenia. I connected with Diana Morrison, Diana Morrison's daughter with Diana Morrison. We talked about the last time Diana Morrison has been independent, past medical history, cognitive, functional impairment, h/o falls. We talked about ros, appetite, weight. Continues with in home 24 hr caregivers. We talked about recent episode of syncope with caregivers. We talked about medications. We talked about medical goals. We talked about role pc in poc. Therapeutic listening, emotional support provided. Discussed PC NP to sign off as pc program changing and currently stable, will add to [facility] RN/PC SW to follow  for further decline History obtained from review of EMR, discussion with daughter, Diana Morrison with Diana Morrison.  I reviewed available labs, medications, imaging, studies and related documents from the EMR.  Records reviewed and summarized above.   ROS 10 point system reviewed negative except HPI  Physical  Exam: Deferred Thank you for the opportunity to participate in the care of Diana Morrison.  The palliative care team will continue to follow. Please call our office at [phone] if we can be of additional assistance.   Diana Nicolls Ihor Gully, NP

## 2022-06-01 NOTE — Progress Notes (Signed)
Patient does not meet PC criteria (active cancer dx or hospice appropriate) at this time.  PC will discharge patient from services.
